# Patient Record
Sex: Male | Born: 1955 | Race: Black or African American | Hispanic: No | Marital: Single | Smoking: Never smoker
Health system: Southern US, Community
[De-identification: ages and names within clinical notes are randomized; demographics above are authoritative.]

## PROBLEM LIST (undated history)

## (undated) DIAGNOSIS — K648 Other hemorrhoids: Secondary | ICD-10-CM

## (undated) DIAGNOSIS — I10 Essential (primary) hypertension: Secondary | ICD-10-CM

## (undated) DIAGNOSIS — T464X5A Adverse effect of angiotensin-converting-enzyme inhibitors, initial encounter: Secondary | ICD-10-CM

## (undated) DIAGNOSIS — R058 Other specified cough: Secondary | ICD-10-CM

## (undated) HISTORY — DX: Adverse effect of angiotensin-converting-enzyme inhibitors, initial encounter: T46.4X5A

## (undated) HISTORY — DX: Other hemorrhoids: K64.8

## (undated) HISTORY — PX: BIOPSY PROSTATE: PRO28

## (undated) HISTORY — DX: Essential (primary) hypertension: I10

## (undated) HISTORY — DX: Other specified cough: R05.8

---

## 2007-03-04 ENCOUNTER — Encounter (INDEPENDENT_AMBULATORY_CARE_PROVIDER_SITE_OTHER): Payer: Self-pay | Admitting: *Deleted

## 2007-03-04 ENCOUNTER — Ambulatory Visit (HOSPITAL_COMMUNITY): Admission: RE | Admit: 2007-03-04 | Discharge: 2007-03-04 | Payer: Self-pay | Admitting: *Deleted

## 2011-02-14 NOTE — Op Note (Signed)
NAMERHODES, CALVERT NO.:  [id number]   MEDICAL RECORD NO.:  [id number]          PATIENT TYPE:  AMB   LOCATION:  ENDO                         FACILITY:  [facility]   PHYSICIAN:  Georgiana Spinner, M.D.    DATE OF BIRTH:  1956-09-27   DATE OF PROCEDURE:  03/04/2007  DATE OF DISCHARGE:                               OPERATIVE REPORT   PROCEDURE:  Colonoscopy with polypectomy.   INDICATIONS:  Colon cancer screening, colon polyp.   ANESTHESIA:  Demerol 80 mg, Versed 10 mg.   PROCEDURE:  With the patient mildly sedated in the left lateral  decubitus position, a rectal examination was performed which was  unremarkable to my examination.  Subsequently, the Pentax videoscopic  colonoscope was inserted into the rectum and passed under direct vision  to the cecum, identified by ileocecal valve and appendiceal orifice.  The prep was slightly suboptimal in that there was tenacious yellow-  green stool seen mostly in the right colon which we washed as best as  possible.  From this point, the colonoscope was slowly withdrawn, taking  circumferential views of colonic mucosa, stopping in the ascending colon  near the hepatic flexure where small polyp was seen, photographed and  removed using snare cautery technique setting of 20/200 blended current.  The polyp was suctioned into the endoscope and retrieved through a  tissue trap.  We then withdrew the colonoscope further, taking  circumferential views of the remaining colonic mucosa, stopping only in  the rectum which appeared normal on direct and showed hemorrhoids on  retroflexed view.  The endoscope was straightened and withdrawn.  The  patient's vital signs and pulse oximeter remained stable.  The patient  tolerated the procedure well without apparent complications.   FINDINGS:  Colon polyp of the ascending colon.  Internal hemorrhoids.  Otherwise an unremarkable exam.   PLAN:  Await biopsy report.  The patient will call me for  results and  follow-up with me as an outpatient.           ______________________________  Georgiana Spinner, M.D.     GMO/MEDQ  D:  03/04/2007  T:  03/04/2007  Job:  [id number]

## 2020-09-27 ENCOUNTER — Other Ambulatory Visit: Payer: Self-pay | Admitting: Urology

## 2020-09-27 DIAGNOSIS — R972 Elevated prostate specific antigen [PSA]: Secondary | ICD-10-CM

## 2020-10-27 ENCOUNTER — Ambulatory Visit
Admission: RE | Admit: 2020-10-27 | Discharge: 2020-10-27 | Disposition: A | Discharge status: Home / Self Care | Payer: Medicare Other | Source: Ambulatory Visit | Attending: Urology | Admitting: Urology

## 2020-10-27 ENCOUNTER — Other Ambulatory Visit: Payer: Self-pay

## 2020-10-27 DIAGNOSIS — I10 Essential (primary) hypertension: Secondary | ICD-10-CM | POA: Diagnosis not present

## 2020-10-27 DIAGNOSIS — Z0000 Encounter for general adult medical examination without abnormal findings: Secondary | ICD-10-CM | POA: Diagnosis not present

## 2020-10-27 DIAGNOSIS — E782 Mixed hyperlipidemia: Secondary | ICD-10-CM | POA: Diagnosis not present

## 2020-10-27 DIAGNOSIS — R590 Localized enlarged lymph nodes: Secondary | ICD-10-CM | POA: Diagnosis not present

## 2020-10-27 DIAGNOSIS — R972 Elevated prostate specific antigen [PSA]: Secondary | ICD-10-CM

## 2020-10-27 MED ORDER — GADOBENATE DIMEGLUMINE 529 MG/ML IV SOLN
18.0000 mL | Freq: Once | INTRAVENOUS | Status: AC | PRN
  Administered 2020-10-27: 18 mL via INTRAVENOUS

## 2020-11-01 DIAGNOSIS — Z0000 Encounter for general adult medical examination without abnormal findings: Secondary | ICD-10-CM | POA: Diagnosis not present

## 2020-11-01 DIAGNOSIS — I493 Ventricular premature depolarization: Secondary | ICD-10-CM | POA: Diagnosis not present

## 2020-11-01 DIAGNOSIS — Z86010 Personal history of colonic polyps: Secondary | ICD-10-CM | POA: Diagnosis not present

## 2020-11-01 DIAGNOSIS — R7303 Prediabetes: Secondary | ICD-10-CM | POA: Diagnosis not present

## 2020-11-01 DIAGNOSIS — E782 Mixed hyperlipidemia: Secondary | ICD-10-CM | POA: Diagnosis not present

## 2020-11-01 DIAGNOSIS — Z23 Encounter for immunization: Secondary | ICD-10-CM | POA: Diagnosis not present

## 2020-11-01 DIAGNOSIS — I10 Essential (primary) hypertension: Secondary | ICD-10-CM | POA: Diagnosis not present

## 2020-11-03 ENCOUNTER — Telehealth: Payer: Self-pay

## 2020-11-03 ENCOUNTER — Other Ambulatory Visit: Payer: Self-pay | Admitting: Internal Medicine

## 2020-11-03 DIAGNOSIS — E782 Mixed hyperlipidemia: Secondary | ICD-10-CM

## 2020-11-03 NOTE — Telephone Encounter (Signed)
NOTES ON FILE FROM [facility] [phone], SENT REFERRAL TO SCHEDULING 

## 2020-11-19 ENCOUNTER — Ambulatory Visit
Admission: RE | Admit: 2020-11-19 | Discharge: 2020-11-19 | Disposition: A | Discharge status: Home / Self Care | Payer: Medicare Other | Source: Ambulatory Visit | Attending: Internal Medicine | Admitting: Internal Medicine

## 2020-11-19 ENCOUNTER — Other Ambulatory Visit: Payer: Self-pay

## 2020-11-19 DIAGNOSIS — E782 Mixed hyperlipidemia: Secondary | ICD-10-CM

## 2020-11-19 DIAGNOSIS — R3911 Hesitancy of micturition: Secondary | ICD-10-CM | POA: Diagnosis not present

## 2020-11-28 NOTE — Progress Notes (Signed)
Cardiology Office Note:    Date:  12/01/2020   ID:  Jeffery [name], DOB [birth year], MRN [id number]  PCP:  Merri Brunette, MD   [facility]  Cardiologist:  No primary care provider on file.  Advanced Practice Provider:  No care team member to display Electrophysiologist:  None    Referring MD: Merri Brunette, MD    History of Present Illness:    Jeffery [name] is a 65 y.o. male with a hx of HTN and HLD who was referred by Dr. Renne Crigler for further evaluation of an abnormal ECG.  Patient states that he overall feels well. Has occasional chest achiness usually after he eats, which he attributes to reflux. Not exertional in nature. Patient is active and does yardwork without chest pain or SOB. No LE edema, orthopnea, or PND. No lightheadedness, dizziness or syncope. No palpitations. ECG shows LVH with PVCs. [name] pressures at home in 140s prior to taking his medication; runs in the 120-130s after taking his medication.   Coronary Calcium Score: Left Main: 0; LAD: 80; LCx: 24; RCA: 93; Total Agatston Score: 198 (82nd percentile for patient's age, gender and race/ethnicity)   Past Medical History:  Diagnosis Date  . Cough due to ACE inhibitor   . Hypertension   . Internal hemorrhoid     Past Surgical History:  Procedure Laterality Date  . BIOPSY PROSTATE      Current Medications: Current Meds  Medication Sig  . AMLODIPINE BESYLATE PO Take 5 mg by mouth.  Marland Kitchen aspirin EC 81 MG tablet Take 1 tablet (81 mg total) by mouth daily. Swallow whole.  . losartan-hydrochlorothiazide (HYZAAR) 100-25 MG tablet Take 1 tablet by mouth daily.  . meloxicam (MOBIC) 15 MG tablet Take 15 mg by mouth as needed.  . Multiple Vitamin (MULTIVITAMIN) tablet Take 1 tablet by mouth daily.  . rosuvastatin (CRESTOR) 20 MG tablet Take 20 mg by mouth at bedtime.     Allergies:   Patient has no known allergies.   Social History   Socioeconomic History  . Marital status: Single    Spouse  name: Not on file  . Number of children: Not on file  . Years of education: Not on file  . Highest education level: Not on file  Occupational History  . Not on file  Tobacco Use  . Smoking status: Never Smoker  . Smokeless tobacco: Never Used  Substance and Sexual Activity  . Alcohol use: Yes    Comment: 3-4 drinks a day  . Drug use: Never  . Sexual activity: Not on file  Other Topics Concern  . Not on file  Social History Narrative  . Not on file   Social Determinants of Health   Financial Resource Strain: Not on file  Food Insecurity: Not on file  Transportation Needs: Not on file  Physical Activity: Not on file  Stress: Not on file  Social Connections: Not on file     Family History: The patient's family history includes Aneurysm in his brother and father; Kidney disease in his father.  ROS:   Please see the history of present illness.    Review of Systems  Constitutional: Negative for chills and fever.  HENT: Negative for hearing loss.   Eyes: Negative for blurred vision and redness.  Respiratory: Negative for shortness of breath.   Cardiovascular: Negative for chest pain, palpitations, orthopnea, claudication, leg swelling and PND.  Gastrointestinal: Positive for heartburn. Negative for melena, nausea and vomiting.  Genitourinary: Negative for  flank pain.  Musculoskeletal: Negative for myalgias.  Neurological: Negative for dizziness and loss of consciousness.  Endo/Heme/Allergies: Negative for polydipsia.  Psychiatric/Behavioral: Negative for substance abuse.    EKGs/Labs/Other Studies Reviewed:    The following studies were reviewed today: CT Calcium 11/19/20 FINDINGS: CORONARY CALCIUM SCORES:  Left Main: 0  LAD: 80  LCx: 24  RCA: 93  Total Agatston Score: 198  MESA database percentile: 82  AORTA MEASUREMENTS:  Ascending Aorta: 37 mm  Descending Aorta: 30 mm  OTHER FINDINGS:  Cardiovascular: Coronary artery calcium as  described. Heart size is top normal without pericardial effusion  Mediastinum/Nodes: No adenopathy in the mediastinum. Limited visualization of the mediastinum. Mildly patulous esophagus.  Lungs/Pleura: Airways are patent, visualized airways. Lungs with respect to visualized portions of the chest are clear.  Upper Abdomen: Incidental imaging of upper abdominal contents is unremarkable.  Musculoskeletal: No acute bone finding or destructive bone process. Spinal degenerative changes.  IMPRESSION: Coronary artery calcium score of 198. This places the patient in the MESA database 82nd percentile for patient's age, gender and race/ethnicity.  3.7 cm ascending thoracic aortic dilation. Recommend annual imaging followup by CTA or MRA. This recommendation follows 2010 ACCF/AHA/AATS/ACR/ASA/SCA/SCAI/SIR/STS/SVM Guidelines for the Diagnosis and Management of Patients with Thoracic Aortic Disease. Circulation.2010; 121: B341-P379. Aortic aneurysm NOS (ICD10-I71.9)  EKG:  EKG is  ordered today.  The ekg ordered today demonstrates NSR with LVH with occasional PVC, HR 71bpm  Recent Labs: No results found for requested labs within last 8760 hours.  Recent Lipid Panel No results found for: CHOL, TRIG, HDL, CHOLHDL, VLDL, LDLCALC, LDLDIRECT   Physical Exam:    VS:  BP 136/90   Pulse 71   Ht 5\' 8"  (1.727 m)   Wt 200 lb 6.4 oz (90.9 kg)   SpO2 98%   BMI 30.47 kg/m     Wt Readings from Last 3 Encounters:  12/01/20 200 lb 6.4 oz (90.9 kg)     GEN:  Well nourished, well developed in no acute distress HEENT: Normal NECK: No JVD; No carotid bruits CARDIAC: RRR, no murmurs, rubs, gallops RESPIRATORY:  Clear to auscultation without rales, wheezing or rhonchi  ABDOMEN: Soft, non-tender, non-distended MUSCULOSKELETAL:  No edema; No deformity  SKIN: Warm and dry NEUROLOGIC:  Alert and oriented x 3 PSYCHIATRIC:  Normal affect   ASSESSMENT:    1. Nonspecific abnormal  electrocardiogram (ECG) (EKG)   2. PVC's (premature ventricular contractions)   3. Coronary artery calcification   4. Primary hypertension   5. Mixed hyperlipidemia    PLAN:    In order of problems listed above:  #Abnormal ECG: #PVCs: #LVH: Patient noted to have frequent PVCs and LVH on routine exam at PCPs office. Patient is reassuringly asymptomatic with no chest pain, SOB, palpitations, lightheadedness, or syncope. Will check TTE and cardiac monitor. -Check 7 day zio to assess PVC burden -Check TTE  #Coronary calcification: Coronary Calcium Score: 198 (82nd percentile for patient's age, gender and race/ethnicity) Left Main: 0; LAD: 70; LCx: 24; RCA: 50. Patient asymptomatic. -Work-up PVCs as above -Start ASA 81mg  daily -Continue crestor 20mg  daily -Diet and lifestyle modifications discussed at length as detailed below  #HTN: -Continue Losartan-HCTZ and amlodipine -Will keep log to ensure [name] pressure and call if BP>120/80s  #HLD: -Continue crestor as above  Exercise recommendations: Goal of exercising for at least 30 minutes a day, at least 5 times per week.  Please exercise to a moderate exertion.  This means that while exercising it is difficult  to speak in full sentences, however you are not so short of breath that you feel you must stop, and not so comfortable that you can carry on a full conversation.  Exertion level should be approximately a 5/10, if 10 is the most exertion you can perform.  Diet recommendations: Recommend a heart healthy diet such as the Mediterranean diet.  This diet consists of plant based foods, healthy fats, lean meats, olive oil.  It suggests limiting the intake of simple carbohydrates such as white breads, pastries, and pastas.  It also limits the amount of red meat, wine, and dairy products such as cheese that one should consume on a daily basis.     Medication Adjustments/Labs and Tests Ordered: Current medicines are reviewed at length with  the patient today.  Concerns regarding medicines are outlined above.  Orders Placed This Encounter  Procedures  . LONG TERM MONITOR (3-14 DAYS)  . EKG 12-Lead  . ECHOCARDIOGRAM COMPLETE   Meds ordered this encounter  Medications  . aspirin EC 81 MG tablet    Sig: Take 1 tablet (81 mg total) by mouth daily. Swallow whole.    Dispense:  90 tablet    Refill:  3    Patient Instructions   Medication Instructions:  1) START ASPIRIN 81 mg daily *If you need a refill on your cardiac medications before your next appointment, please call your pharmacy*  Testing/Procedures: Your provider has requested that you have an echocardiogram. Echocardiography is a painless test that uses sound waves to create images of your heart. It provides your doctor with information about the size and shape of your heart and how well your heart's chambers and valves are working. This procedure takes approximately one hour. There are no restrictions for this procedure.   Dr. Shari ProwsPemberton recommends you wear a 7 day heart monitor.  Follow-Up: At [facility], you and your health needs are our priority.  As part of our continuing mission to provide you with exceptional heart care, we have created designated Provider Care Teams.  These Care Teams include your primary Cardiologist (physician) and Advanced Practice Providers (APPs -  Physician Assistants and Nurse Practitioners) who all work together to provide you with the care you need, when you need it. Your next appointment:   6 month(s) The format for your next appointment:   In Person Provider:   You may see Dr. Shari ProwsPemberton or one of the following Advanced Practice Providers on your designated Care Team:    Tereso NewcomerScott Weaver, PA-C  Vin Bhagat, PA-C   ZIO XT- Long Term Monitor Instructions   Your physician has requested you wear your ZIO patch monitor 7 days.   This is a single patch monitor.  [employer] supplies one patch monitor per enrollment.  Additional stickers  are not available.   Please do not apply patch if you will be having a Nuclear Stress Test, Echocardiogram, Cardiac CT, MRI, or Chest Xray during the time frame you would be wearing the monitor. The patch cannot be worn during these tests.  You cannot remove and re-apply the ZIO XT patch monitor.   Your ZIO patch monitor will be sent USPS Priority mail from [facility] directly to your home address. The monitor may also be mailed to a PO BOX if home delivery is not available.   It may take 3-5 days to receive your monitor after you have been enrolled.   Once you have received you monitor, please review enclosed instructions.  Your monitor has already  been registered assigning a specific monitor serial # to you.   Applying the monitor   Shave hair from upper left chest.   Hold abrader disc by orange tab.  Rub abrader in 40 strokes over left upper chest as indicated in your monitor instructions.   Clean area with 4 enclosed alcohol pads .  Use all pads to assure are is cleaned thoroughly.  Let dry.   Apply patch as indicated in monitor instructions.  Patch will be place under collarbone on left side of chest with arrow pointing upward.   Rub patch adhesive wings for 2 minutes.Remove white label marked "1".  Remove white label marked "2".  Rub patch adhesive wings for 2 additional minutes.   While looking in a mirror, press and release button in center of patch.  A small green light will flash 3-4 times .  This will be your only indicator the monitor has been turned on.     Do not shower for the first 24 hours.  You may shower after the first 24 hours.   Press button if you feel a symptom. You will hear a small click.  Record Date, Time and Symptom in the Patient Log Book.   When you are ready to remove patch, follow instructions on last 2 pages of Patient Log Book.  Stick patch monitor onto last page of Patient Log Book.   Place Patient Log Book in [place] box.  Use locking tab on box  and tape box closed securely.  The [employer] has [employer] on it.  Please place in mailbox as soon as possible.  Your physician should have your test results approximately 7 days after the monitor has been mailed back to [facility].   Call [facility] Customer Care at [phone] if you have questions regarding your ZIO XT patch monitor.  Call them immediately if you see an orange light blinking on your monitor.   If your monitor falls off in less than 4 days contact our Monitor department at [phone].  If your monitor becomes loose or falls off after 4 days call [employer] at [phone] for suggestions on securing your monitor.    Mediterranean Diet A Mediterranean diet refers to food and lifestyle choices that are based on the traditions of countries located on the [place]. This way of eating has been shown to help prevent certain conditions and improve outcomes for people who have chronic diseases, like kidney disease and heart disease. What are tips for following this plan? Lifestyle  Cook and eat meals together with your family, when possible.  Drink enough fluid to keep your urine clear or pale yellow.  Be physically active every day. This includes: ? Aerobic exercise like running or swimming. ? Leisure activities like gardening, walking, or housework.  Get 7-8 hours of sleep each night.  If recommended by your health care provider, drink red wine in moderation. This means 1 glass a day for nonpregnant women and 2 glasses a day for men. A glass of wine equals 5 oz (150 mL). Reading food labels  Check the serving size of packaged foods. For foods such as rice and pasta, the serving size refers to the amount of cooked product, not dry.  Check the total fat in packaged foods. Avoid foods that have saturated fat or trans fats.  Check the ingredients list for added sugars, such as corn syrup.   Shopping  At the grocery store, buy most of your  food from  the areas near the walls of the store. This includes: ? Fresh fruits and vegetables (produce). ? Grains, beans, nuts, and seeds. Some of these may be available in unpackaged forms or large amounts (in bulk). ? Fresh seafood. ? Poultry and eggs. ? Low-fat dairy products.  Buy whole ingredients instead of prepackaged foods.  Buy fresh fruits and vegetables in-season from local farmers markets.  Buy frozen fruits and vegetables in resealable bags.  If you do not have access to quality fresh seafood, buy precooked frozen shrimp or canned fish, such as tuna, salmon, or sardines.  Buy small amounts of raw or cooked vegetables, salads, or olives from the deli or salad bar at your store.  Stock your pantry so you always have certain foods on hand, such as olive oil, canned tuna, canned tomatoes, rice, pasta, and beans. Cooking  Cook foods with extra-virgin olive oil instead of using butter or other vegetable oils.  Have meat as a side dish, and have vegetables or grains as your main dish. This means having meat in small portions or adding small amounts of meat to foods like pasta or stew.  Use beans or vegetables instead of meat in common dishes like chili or lasagna.  Experiment with different cooking methods. Try roasting or broiling vegetables instead of steaming or sauteing them.  Add frozen vegetables to soups, stews, pasta, or rice.  Add nuts or seeds for added healthy fat at each meal. You can add these to yogurt, salads, or vegetable dishes.  Marinate fish or vegetables using olive oil, lemon juice, garlic, and fresh herbs. Meal planning  Plan to eat 1 vegetarian meal one day each week. Try to work up to 2 vegetarian meals, if possible.  Eat seafood 2 or more times a week.  Have healthy snacks readily available, such as: ? Vegetable sticks with hummus. ? [place] yogurt. ? Fruit and nut trail mix.  Eat balanced meals throughout the week. This includes: ? Fruit:  2-3 servings a day ? Vegetables: 4-5 servings a day ? Low-fat dairy: 2 servings a day ? Fish, poultry, or lean meat: 1 serving a day ? Beans and legumes: 2 or more servings a week ? Nuts and seeds: 1-2 servings a day ? Whole grains: 6-8 servings a day ? Extra-virgin olive oil: 3-4 servings a day  Limit red meat and sweets to only a few servings a month   What are my food choices?  Mediterranean diet ? Recommended  Grains: Whole-grain pasta. Brown rice. Bulgar wheat. Polenta. Couscous. Whole-wheat bread. Orpah Cobb.  Vegetables: Artichokes. Beets. Broccoli. Cabbage. Carrots. Eggplant. Green beans. Chard. Kale. Spinach. Onions. Leeks. Peas. Squash. Tomatoes. Peppers. Radishes.  Fruits: Apples. Apricots. Avocado. Berries. Bananas. Cherries. Dates. Figs. Grapes. Lemons. Melon. Oranges. Peaches. Plums. Pomegranate.  Meats and other protein foods: Beans. Almonds. Sunflower seeds. Pine nuts. Peanuts. Cod. Salmon. Scallops. Shrimp. Tuna. Tilapia. Clams. Oysters. Eggs.  Dairy: Low-fat milk. Cheese. Greek yogurt.  Beverages: Water. Red wine. Herbal tea.  Fats and oils: Extra virgin olive oil. Avocado oil. Grape seed oil.  Sweets and desserts: [place] yogurt with honey. Baked apples. Poached pears. Trail mix.  Seasoning and other foods: Basil. Cilantro. Coriander. Cumin. Mint. Parsley. Sage. Rosemary. Tarragon. Garlic. Oregano. Thyme. Pepper. Balsalmic vinegar. Tahini. Hummus. Tomato sauce. Olives. Mushrooms. ? Limit these  Grains: Prepackaged pasta or rice dishes. Prepackaged cereal with added sugar.  Vegetables: Deep fried potatoes (french fries).  Fruits: Fruit canned in syrup.  Meats and other protein foods: Beef. Pork.  Leslie Dales with skin. Hot dogs. Tomasa Blase.  Dairy: Ice cream. Sour cream. Whole milk.  Beverages: Juice. Sugar-sweetened soft drinks. Beer. Liquor and spirits.  Fats and oils: Butter. Canola oil. Vegetable oil. Beef fat (tallow). Lard.  Sweets and desserts:  Cookies. Cakes. Pies. Candy.  Seasoning and other foods: Mayonnaise. Premade sauces and marinades. The items listed may not be a complete list. Talk with your dietitian about what dietary choices are right for you. Summary  The Mediterranean diet includes both food and lifestyle choices.  Eat a variety of fresh fruits and vegetables, beans, nuts, seeds, and whole grains.  Limit the amount of red meat and sweets that you eat.  Talk with your health care provider about whether it is safe for you to drink red wine in moderation. This means 1 glass a day for nonpregnant women and 2 glasses a day for men. A glass of wine equals 5 oz (150 mL). This information is not intended to replace advice given to you by your health care provider. Make sure you discuss any questions you have with your health care provider. Document Revised: 05/18/2016 Document Reviewed: 05/11/2016 Elsevier Patient Education  2020 [employer].     Signed, Meriam Sprague, MD  12/01/2020 10:47 AM    [facility]

## 2020-11-30 DIAGNOSIS — I2510 Atherosclerotic heart disease of native coronary artery without angina pectoris: Secondary | ICD-10-CM | POA: Diagnosis not present

## 2020-11-30 DIAGNOSIS — I77810 Thoracic aortic ectasia: Secondary | ICD-10-CM | POA: Diagnosis not present

## 2020-11-30 DIAGNOSIS — R7309 Other abnormal glucose: Secondary | ICD-10-CM | POA: Diagnosis not present

## 2020-11-30 DIAGNOSIS — I10 Essential (primary) hypertension: Secondary | ICD-10-CM | POA: Diagnosis not present

## 2020-12-01 ENCOUNTER — Ambulatory Visit: Payer: Medicare Other | Admitting: Cardiology

## 2020-12-01 ENCOUNTER — Ambulatory Visit (INDEPENDENT_AMBULATORY_CARE_PROVIDER_SITE_OTHER): Payer: Medicare Other

## 2020-12-01 ENCOUNTER — Encounter: Payer: Self-pay | Admitting: *Deleted

## 2020-12-01 ENCOUNTER — Other Ambulatory Visit: Payer: Self-pay

## 2020-12-01 ENCOUNTER — Encounter: Payer: Self-pay | Admitting: Cardiology

## 2020-12-01 VITALS — BP 136/90 | HR 71 | Ht 68.0 in | Wt 200.4 lb

## 2020-12-01 DIAGNOSIS — I2510 Atherosclerotic heart disease of native coronary artery without angina pectoris: Secondary | ICD-10-CM | POA: Diagnosis not present

## 2020-12-01 DIAGNOSIS — I10 Essential (primary) hypertension: Secondary | ICD-10-CM

## 2020-12-01 DIAGNOSIS — I493 Ventricular premature depolarization: Secondary | ICD-10-CM

## 2020-12-01 DIAGNOSIS — R9431 Abnormal electrocardiogram [ECG] [EKG]: Secondary | ICD-10-CM

## 2020-12-01 DIAGNOSIS — I2584 Coronary atherosclerosis due to calcified coronary lesion: Secondary | ICD-10-CM

## 2020-12-01 DIAGNOSIS — E782 Mixed hyperlipidemia: Secondary | ICD-10-CM

## 2020-12-01 MED ORDER — ASPIRIN EC 81 MG PO TBEC: 81.0000 mg | DELAYED_RELEASE_TABLET | Freq: Every day | ORAL | 3 refills | Status: AC

## 2020-12-01 NOTE — Patient Instructions (Signed)
Medication Instructions:  1) START ASPIRIN 81 mg daily *If you need a refill on your cardiac medications before your next appointment, please call your pharmacy*  Testing/Procedures: Your provider has requested that you have an echocardiogram. Echocardiography is a painless test that uses sound waves to create images of your heart. It provides your doctor with information about the size and shape of your heart and how well your heart's chambers and valves are working. This procedure takes approximately one hour. There are no restrictions for this procedure.   Dr. Shari Prows recommends you wear a 7 day heart monitor.  Follow-Up: At [facility], you and your health needs are our priority.  As part of our continuing mission to provide you with exceptional heart care, we have created designated Provider Care Teams.  These Care Teams include your primary Cardiologist (physician) and Advanced Practice Providers (APPs -  Physician Assistants and Nurse Practitioners) who all work together to provide you with the care you need, when you need it. Your next appointment:   6 month(s) The format for your next appointment:   In Person Provider:   You may see Dr. Shari Prows or one of the following Advanced Practice Providers on your designated Care Team:    Tereso Newcomer, PA-C  Vin Bhagat, PA-C   ZIO XT- Long Term Monitor Instructions   Your physician has requested you wear your ZIO patch monitor 7 days.   This is a single patch monitor.  [employer] supplies one patch monitor per enrollment.  Additional stickers are not available.   Please do not apply patch if you will be having a Nuclear Stress Test, Echocardiogram, Cardiac CT, MRI, or Chest Xray during the time frame you would be wearing the monitor. The patch cannot be worn during these tests.  You cannot remove and re-apply the ZIO XT patch monitor.   Your ZIO patch monitor will be sent USPS Priority mail from [facility] directly to your  home address. The monitor may also be mailed to a PO BOX if home delivery is not available.   It may take 3-5 days to receive your monitor after you have been enrolled.   Once you have received you monitor, please review enclosed instructions.  Your monitor has already been registered assigning a specific monitor serial # to you.   Applying the monitor   Shave hair from upper left chest.   Hold abrader disc by orange tab.  Rub abrader in 40 strokes over left upper chest as indicated in your monitor instructions.   Clean area with 4 enclosed alcohol pads .  Use all pads to assure are is cleaned thoroughly.  Let dry.   Apply patch as indicated in monitor instructions.  Patch will be place under collarbone on left side of chest with arrow pointing upward.   Rub patch adhesive wings for 2 minutes.Remove white label marked "1".  Remove white label marked "2".  Rub patch adhesive wings for 2 additional minutes.   While looking in a mirror, press and release button in center of patch.  A small green light will flash 3-4 times .  This will be your only indicator the monitor has been turned on.     Do not shower for the first 24 hours.  You may shower after the first 24 hours.   Press button if you feel a symptom. You will hear a small click.  Record Date, Time and Symptom in the Patient Log Book.   When you are ready  to remove patch, follow instructions on last 2 pages of Patient Log Book.  Stick patch monitor onto last page of Patient Log Book.   Place Patient Log Book in Rose box.  Use locking tab on box and tape box closed securely.  The [employer] and [employer] has [employer] on it.  Please place in mailbox as soon as possible.  Your physician should have your test results approximately 7 days after the monitor has been mailed back to [facility].   Call [facility] Customer Care at [phone] if you have questions regarding your ZIO XT patch monitor.  Call them immediately if you see  an orange light blinking on your monitor.   If your monitor falls off in less than 4 days contact our Monitor department at [phone].  If your monitor becomes loose or falls off after 4 days call [employer] at [phone] for suggestions on securing your monitor.    Mediterranean Diet A Mediterranean diet refers to food and lifestyle choices that are based on the traditions of countries located on the [place]. This way of eating has been shown to help prevent certain conditions and improve outcomes for people who have chronic diseases, like kidney disease and heart disease. What are tips for following this plan? Lifestyle  Cook and eat meals together with your family, when possible.  Drink enough fluid to keep your urine clear or pale yellow.  Be physically active every day. This includes: ? Aerobic exercise like running or swimming. ? Leisure activities like gardening, walking, or housework.  Get 7-8 hours of sleep each night.  If recommended by your health care provider, drink red wine in moderation. This means 1 glass a day for nonpregnant women and 2 glasses a day for men. A glass of wine equals 5 oz (150 mL). Reading food labels  Check the serving size of packaged foods. For foods such as rice and pasta, the serving size refers to the amount of cooked product, not dry.  Check the total fat in packaged foods. Avoid foods that have saturated fat or trans fats.  Check the ingredients list for added sugars, such as corn syrup.   Shopping  At the grocery store, buy most of your food from the areas near the walls of the store. This includes: ? Fresh fruits and vegetables (produce). ? Grains, beans, nuts, and seeds. Some of these may be available in unpackaged forms or large amounts (in bulk). ? Fresh seafood. ? Poultry and eggs. ? Low-fat dairy products.  Buy whole ingredients instead of prepackaged foods.  Buy fresh fruits and vegetables in-season from local  farmers markets.  Buy frozen fruits and vegetables in resealable bags.  If you do not have access to quality fresh seafood, buy precooked frozen shrimp or canned fish, such as tuna, salmon, or sardines.  Buy small amounts of raw or cooked vegetables, salads, or olives from the deli or salad bar at your store.  Stock your pantry so you always have certain foods on hand, such as olive oil, canned tuna, canned tomatoes, rice, pasta, and beans. Cooking  Cook foods with extra-virgin olive oil instead of using butter or other vegetable oils.  Have meat as a side dish, and have vegetables or grains as your main dish. This means having meat in small portions or adding small amounts of meat to foods like pasta or stew.  Use beans or vegetables instead of meat in common dishes like chili or lasagna.  Experiment with different  cooking methods. Try roasting or broiling vegetables instead of steaming or sauteing them.  Add frozen vegetables to soups, stews, pasta, or rice.  Add nuts or seeds for added healthy fat at each meal. You can add these to yogurt, salads, or vegetable dishes.  Marinate fish or vegetables using olive oil, lemon juice, garlic, and fresh herbs. Meal planning  Plan to eat 1 vegetarian meal one day each week. Try to work up to 2 vegetarian meals, if possible.  Eat seafood 2 or more times a week.  Have healthy snacks readily available, such as: ? Vegetable sticks with hummus. ? [place] yogurt. ? Fruit and nut trail mix.  Eat balanced meals throughout the week. This includes: ? Fruit: 2-3 servings a day ? Vegetables: 4-5 servings a day ? Low-fat dairy: 2 servings a day ? Fish, poultry, or lean meat: 1 serving a day ? Beans and legumes: 2 or more servings a week ? Nuts and seeds: 1-2 servings a day ? Whole grains: 6-8 servings a day ? Extra-virgin olive oil: 3-4 servings a day  Limit red meat and sweets to only a few servings a month   What are my food  choices?  Mediterranean diet ? Recommended  Grains: Whole-grain pasta. Brown rice. Bulgar wheat. Polenta. Couscous. Whole-wheat bread. Orpah Cobb.  Vegetables: Artichokes. Beets. Broccoli. Cabbage. Carrots. Eggplant. Green beans. Chard. Kale. Spinach. Onions. Leeks. Peas. Squash. Tomatoes. Peppers. Radishes.  Fruits: Apples. Apricots. Avocado. Berries. Bananas. Cherries. Dates. Figs. Grapes. Lemons. Melon. Oranges. Peaches. Plums. Pomegranate.  Meats and other protein foods: Beans. Almonds. Sunflower seeds. Pine nuts. Peanuts. Cod. Salmon. Scallops. Shrimp. Tuna. Tilapia. Clams. Oysters. Eggs.  Dairy: Low-fat milk. Cheese. Greek yogurt.  Beverages: Water. Red wine. Herbal tea.  Fats and oils: Extra virgin olive oil. Avocado oil. Grape seed oil.  Sweets and desserts: [place] yogurt with honey. Baked apples. Poached pears. Trail mix.  Seasoning and other foods: Basil. Cilantro. Coriander. Cumin. Mint. Parsley. Sage. Rosemary. Tarragon. Garlic. Oregano. Thyme. Pepper. Balsalmic vinegar. Tahini. Hummus. Tomato sauce. Olives. Mushrooms. ? Limit these  Grains: Prepackaged pasta or rice dishes. Prepackaged cereal with added sugar.  Vegetables: Deep fried potatoes (french fries).  Fruits: Fruit canned in syrup.  Meats and other protein foods: Beef. Pork. Lamb. Poultry with skin. Hot dogs. Tomasa Blase.  Dairy: Ice cream. Sour cream. Whole milk.  Beverages: Juice. Sugar-sweetened soft drinks. Beer. Liquor and spirits.  Fats and oils: Butter. Canola oil. Vegetable oil. Beef fat (tallow). Lard.  Sweets and desserts: Cookies. Cakes. Pies. Candy.  Seasoning and other foods: Mayonnaise. Premade sauces and marinades. The items listed may not be a complete list. Talk with your dietitian about what dietary choices are right for you. Summary  The Mediterranean diet includes both food and lifestyle choices.  Eat a variety of fresh fruits and vegetables, beans, nuts, seeds, and whole  grains.  Limit the amount of red meat and sweets that you eat.  Talk with your health care provider about whether it is safe for you to drink red wine in moderation. This means 1 glass a day for nonpregnant women and 2 glasses a day for men. A glass of wine equals 5 oz (150 mL). This information is not intended to replace advice given to you by your health care provider. Make sure you discuss any questions you have with your health care provider. Document Revised: 05/18/2016 Document Reviewed: 05/11/2016 Elsevier Patient Education  2020 [employer].

## 2020-12-01 NOTE — Progress Notes (Signed)
Patient ID: Jeffery [name], male   DOB: 1956/09/16, 65 y.o.   MRN: [id number] Patient enrolled for [employer] to ship a 7 day ZIO XT long term holter monitor to his home.

## 2020-12-03 DIAGNOSIS — I493 Ventricular premature depolarization: Secondary | ICD-10-CM

## 2020-12-16 DIAGNOSIS — I493 Ventricular premature depolarization: Secondary | ICD-10-CM | POA: Diagnosis not present

## 2020-12-20 ENCOUNTER — Telehealth: Payer: Self-pay | Admitting: Cardiology

## 2020-12-20 ENCOUNTER — Other Ambulatory Visit: Payer: Self-pay | Admitting: Cardiology

## 2020-12-20 ENCOUNTER — Telehealth: Payer: Self-pay | Admitting: *Deleted

## 2020-12-20 DIAGNOSIS — I472 Ventricular tachycardia, unspecified: Secondary | ICD-10-CM

## 2020-12-20 DIAGNOSIS — R0602 Shortness of breath: Secondary | ICD-10-CM

## 2020-12-20 DIAGNOSIS — I493 Ventricular premature depolarization: Secondary | ICD-10-CM

## 2020-12-20 NOTE — Telephone Encounter (Signed)
-----   Message from Meriam Sprague, MD sent at 12/20/2020  3:27 PM EDT ----- The patient is having a lot of runs of non-sustained VT and frequent PVCs on his heart monitor. Given the amount of abnormal beats from the bottom chamber of the heart, we should make sure there is no blockages in his heart arteries and that his heart structure is normal. Can we get a lexiscan and a cardiac MRI? These will help compliment the echo in terms of work-up of these extra beats.

## 2020-12-20 NOTE — Telephone Encounter (Signed)
Will route to Dr. Shari Prows to sign attestation that is pended in phone note.

## 2020-12-20 NOTE — Telephone Encounter (Signed)
Spoke with patient regarding preferred weekdays and times for scheduling the Cardiac MRI ordered by Dr. Shari Prows.  Informed patient as soon as we hear regarding the insurance prior authorization--I will be in touch with the appointment information----he voiced his understanding.

## 2020-12-20 NOTE — Telephone Encounter (Signed)
Spoke with pt and made him aware of results and recommendations per Dr. Shari Prows.  Went over stress test instructions.  Advised someone from both depts would be in contact to get him scheduled.  Pt verbalized understanding and was appreciative for call.

## 2020-12-22 ENCOUNTER — Encounter: Payer: Self-pay | Admitting: Cardiology

## 2020-12-22 NOTE — Telephone Encounter (Signed)
Patient aware of Friday 02/11/21 9:00 am Cardiac MRI appointment at [facility]---arrival time is 8:30 am--1st floor admissions office for check in----will mail information to patient and it is available in My Chart.

## 2020-12-24 ENCOUNTER — Ambulatory Visit (HOSPITAL_COMMUNITY): Payer: Medicare Other | Attending: Cardiology

## 2020-12-24 ENCOUNTER — Other Ambulatory Visit: Payer: Self-pay

## 2020-12-24 DIAGNOSIS — E785 Hyperlipidemia, unspecified: Secondary | ICD-10-CM | POA: Insufficient documentation

## 2020-12-24 DIAGNOSIS — R9431 Abnormal electrocardiogram [ECG] [EKG]: Secondary | ICD-10-CM | POA: Diagnosis not present

## 2020-12-24 DIAGNOSIS — I493 Ventricular premature depolarization: Secondary | ICD-10-CM | POA: Insufficient documentation

## 2020-12-24 DIAGNOSIS — I10 Essential (primary) hypertension: Secondary | ICD-10-CM | POA: Insufficient documentation

## 2020-12-24 LAB — ECHOCARDIOGRAM COMPLETE
Area-P 1/2: 2.82 cm2
S' Lateral: 3.9 cm

## 2020-12-28 NOTE — Addendum Note (Signed)
Addended by: Laurance Flatten on: 12/28/2020 01:11 PM   Modules accepted: Orders

## 2021-01-10 ENCOUNTER — Telehealth (HOSPITAL_COMMUNITY): Payer: Self-pay

## 2021-01-10 DIAGNOSIS — I2510 Atherosclerotic heart disease of native coronary artery without angina pectoris: Secondary | ICD-10-CM | POA: Diagnosis not present

## 2021-01-10 DIAGNOSIS — R7309 Other abnormal glucose: Secondary | ICD-10-CM | POA: Diagnosis not present

## 2021-01-10 DIAGNOSIS — I10 Essential (primary) hypertension: Secondary | ICD-10-CM | POA: Diagnosis not present

## 2021-01-10 NOTE — Telephone Encounter (Signed)
Spoke with the patient, detailed instructions given. He stated that understood and would be here for his test. Asked to call back with any questions. S.Marilyne Haseley EMTP

## 2021-01-12 DIAGNOSIS — I10 Essential (primary) hypertension: Secondary | ICD-10-CM | POA: Diagnosis not present

## 2021-01-12 DIAGNOSIS — I2510 Atherosclerotic heart disease of native coronary artery without angina pectoris: Secondary | ICD-10-CM | POA: Diagnosis not present

## 2021-01-12 DIAGNOSIS — R7309 Other abnormal glucose: Secondary | ICD-10-CM | POA: Diagnosis not present

## 2021-01-12 DIAGNOSIS — I77810 Thoracic aortic ectasia: Secondary | ICD-10-CM | POA: Diagnosis not present

## 2021-01-13 ENCOUNTER — Other Ambulatory Visit: Payer: Self-pay

## 2021-01-13 ENCOUNTER — Ambulatory Visit (HOSPITAL_COMMUNITY): Payer: Medicare Other | Attending: Internal Medicine

## 2021-01-13 DIAGNOSIS — I493 Ventricular premature depolarization: Secondary | ICD-10-CM | POA: Diagnosis not present

## 2021-01-13 LAB — MYOCARDIAL PERFUSION IMAGING
LV dias vol: 206 mL (ref 62–150)
LV sys vol: 122 mL
Peak HR: 102 {beats}/min
Rest HR: 73 {beats}/min
SDS: 1
SRS: 0
SSS: 1
TID: 1.07

## 2021-01-13 MED ORDER — TECHNETIUM TC 99M TETROFOSMIN IV KIT
10.4000 | PACK | Freq: Once | INTRAVENOUS | Status: AC | PRN
  Administered 2021-01-13: 10.4 via INTRAVENOUS
  Filled 2021-01-13: qty 11

## 2021-01-13 MED ORDER — REGADENOSON 0.4 MG/5ML IV SOLN
0.4000 mg | Freq: Once | INTRAVENOUS | Status: AC
Start: 2021-01-13 — End: 2021-01-13
  Administered 2021-01-13: 0.4 mg via INTRAVENOUS

## 2021-01-13 MED ORDER — TECHNETIUM TC 99M TETROFOSMIN IV KIT
31.2000 | PACK | Freq: Once | INTRAVENOUS | Status: AC | PRN
  Administered 2021-01-13: 31.2 via INTRAVENOUS
  Filled 2021-01-13: qty 32

## 2021-01-19 ENCOUNTER — Telehealth: Payer: Self-pay | Admitting: Cardiology

## 2021-01-19 NOTE — Telephone Encounter (Signed)
-----   Message from Meriam Sprague, MD sent at 01/18/2021  1:31 PM EDT ----- Stress test is mild decreased blood flow in a small segment of the heart. The cardiac MRI will help work this up further and will better determine if this is truly decreased blood flow or artifact related to the test and not a true defect.

## 2021-01-19 NOTE — Telephone Encounter (Signed)
The patient has been notified of the result and verbalized understanding.  All questions (if any) were answered. Pt aware to follow-up as planned for Cardiac MRI on 02/11/21 at 0900 and [facility].  Pt verbalized understanding and agrees with this plan.

## 2021-01-19 NOTE — Telephone Encounter (Signed)
Patient is calling back for results.

## 2021-01-29 DIAGNOSIS — I2510 Atherosclerotic heart disease of native coronary artery without angina pectoris: Secondary | ICD-10-CM | POA: Diagnosis not present

## 2021-01-29 DIAGNOSIS — M170 Bilateral primary osteoarthritis of knee: Secondary | ICD-10-CM | POA: Diagnosis not present

## 2021-01-29 DIAGNOSIS — E782 Mixed hyperlipidemia: Secondary | ICD-10-CM | POA: Diagnosis not present

## 2021-01-29 DIAGNOSIS — I10 Essential (primary) hypertension: Secondary | ICD-10-CM | POA: Diagnosis not present

## 2021-02-04 ENCOUNTER — Other Ambulatory Visit (HOSPITAL_COMMUNITY): Payer: Medicare Other

## 2021-02-09 ENCOUNTER — Telehealth (HOSPITAL_COMMUNITY): Payer: Self-pay | Admitting: Emergency Medicine

## 2021-02-09 NOTE — Telephone Encounter (Signed)
Reaching out to patient to offer assistance regarding upcoming cardiac imaging study; pt verbalizes understanding of appt date/time, parking situation and where to check in,and verified current allergies; name and call back number provided for further questions should they arise Jeffery Alexandria RN Navigator Cardiac Imaging [name] [phone] office [phone] cell  Spoke with wife who confirmed patient does not have metal implants or claustro Jeffery [name]

## 2021-02-11 ENCOUNTER — Ambulatory Visit (HOSPITAL_COMMUNITY)
Admission: RE | Admit: 2021-02-11 | Discharge: 2021-02-11 | Disposition: A | Discharge status: Home / Self Care | Payer: Medicare Other | Source: Ambulatory Visit | Attending: Cardiology | Admitting: Cardiology

## 2021-02-11 ENCOUNTER — Other Ambulatory Visit: Payer: Self-pay

## 2021-02-11 DIAGNOSIS — I493 Ventricular premature depolarization: Secondary | ICD-10-CM | POA: Diagnosis not present

## 2021-02-11 MED ORDER — GADOBUTROL 1 MMOL/ML IV SOLN
7.0000 mL | Freq: Once | INTRAVENOUS | Status: AC | PRN
  Administered 2021-02-11: 7 mL via INTRAVENOUS

## 2021-02-15 NOTE — Progress Notes (Signed)
Cardiology Office Note:    Date:  02/17/2021   ID:  [name], DOB [birth year], MRN [id number]  PCP:  Merri Brunette, MD   [facility]  Cardiologist:  None  Advanced Practice Provider:  No care team member to display Electrophysiologist:  None    Referring MD: Merri Brunette, MD    History of Present Illness:    Jeffery [name] is a 65 y.o. male with a hx of coronary calcification on CT chest, HTN, HLD, nonischemic cardiomyopathy with LVEF 39% on cMR, and frequent PVCs on cardiac monitor who now presents to clinic for follow-up for his newly diagnosed cardiomyopathy.  During last visit on 12/01/20, he was overall feeling well from a CV standpoint. ECG showed LVH with PVCs which prompted his initial referral. We obtained a cardiac monitor on 12/16/20 which showed 245 runs of nonsustained VT 5-7 beats; 2 runs of SVT (nonsustained), frequent PVCs (7.1%), no afib or sustained arrhythmias. TTE with LVEF 50-55%, G1DD, normal RV, dailated aortic root 75mm, RAP . Lexiscan with small fixed defect in the mid-to-apical inferior location. CMR with LVEF 39% no LGE, normal RV, suspected nonischemic CM.  Coronary Calcium Score: Left Main: 0; LAD: 80; LCx: 24; RCA: 93; Total Agatston Score: 198 (82nd percentile for patient's age, gender and race/ethnicity).  Today, the patient feels well. No chest pain, SOB, LE edema, orthopnea, lightheadedness, and dizziness. Has occasional fluttering in the chest, but this is not overly bothersome. He is tolerating his medications and blood pressure is well controlled at home.   Past Medical History:  Diagnosis Date  . Cough due to ACE inhibitor   . Hypertension   . Internal hemorrhoid     Past Surgical History:  Procedure Laterality Date  . BIOPSY PROSTATE      Current Medications: Current Meds  Medication Sig  . aspirin EC 81 MG tablet Take 1 tablet (81 mg total) by mouth daily. Swallow whole.  . carvedilol (COREG) 6.25 MG  tablet Take 1 tablet (6.25 mg total) by mouth 2 (two) times daily.  . Multiple Vitamin (MULTIVITAMIN) tablet Take 1 tablet by mouth daily.  . rosuvastatin (CRESTOR) 20 MG tablet Take 20 mg by mouth at bedtime.  . sacubitril-valsartan (ENTRESTO) 24-26 MG Take 1 tablet by mouth 2 (two) times daily.  . [DISCONTINUED] amLODipine (NORVASC) 5 MG tablet Take 5 mg by mouth daily.  . [DISCONTINUED] losartan-hydrochlorothiazide (HYZAAR) 100-25 MG tablet Take 1 tablet by mouth daily.     Allergies:   Patient has no known allergies.   Social History   Socioeconomic History  . Marital status: Single    Spouse name: Not on file  . Number of children: Not on file  . Years of education: Not on file  . Highest education level: Not on file  Occupational History  . Not on file  Tobacco Use  . Smoking status: Never Smoker  . Smokeless tobacco: Never Used  Substance and Sexual Activity  . Alcohol use: Yes    Comment: 3-4 drinks a day  . Drug use: Never  . Sexual activity: Not on file  Other Topics Concern  . Not on file  Social History Narrative  . Not on file   Social Determinants of Health   Financial Resource Strain: Not on file  Food Insecurity: Not on file  Transportation Needs: Not on file  Physical Activity: Not on file  Stress: Not on file  Social Connections: Not on file     Family History:  The patient's family history includes Aneurysm in his brother and father; Kidney disease in his father.  ROS:   Please see the history of present illness.    Review of Systems  Constitutional: Negative for chills and fever.  HENT: Negative for hearing loss.   Eyes: Negative for blurred vision.  Respiratory: Positive for cough. Negative for shortness of breath.   Cardiovascular: Positive for palpitations. Negative for chest pain, orthopnea, claudication, leg swelling and PND.  Gastrointestinal: Negative for nausea and vomiting.  Genitourinary: Negative for hematuria.  Musculoskeletal:  Negative for myalgias.  Neurological: Negative for dizziness and focal weakness.  Psychiatric/Behavioral: Negative for substance abuse.    EKGs/Labs/Other Studies Reviewed:    The following studies were reviewed today: CMR 15-Feb-2021: IMPRESSION: 1.  Technically difficult study due to respiratory artifact.  2.   Mildly dilated left ventricle with diffuse hypokinesis, EF 39%.  3.  Normal RV size with EF 45%.  4. No myocardial LGE, so no definitive evidence for prior infarct, myocarditis, or infiltrative disease.  5.  Mildly elevated ECV percentage is nonspecific.  Suspect nonischemic cardiomyopathy.  Myoview 01/13/21:  Nuclear stress EF: 41%.  The left ventricular ejection fraction is moderately decreased (30-44%).  There was no ST segment deviation noted during stress.  Findings consistent with prior myocardial infarction.  This is an intermediate risk study.   There is a small, fixed defect of mild severity present in the mid inferior and apical inferior location, suggestive of prior infarct. There is also mild-moderately reduced LV ejection fraction and severe enlargement of LV end diastolic volume. Reduction in LVEF may be out of proportion to the degree of perfusion defect. Findings suggest a mixed cardiomyopathic process.  TTE 12/24/20: IMPRESSIONS    1. Left ventricular ejection fraction, by estimation, is 50 to 55%. The  left ventricle has low normal function. The left ventricle has no regional  wall motion abnormalities. The left ventricular internal cavity size was  mildly dilated. Left ventricular  diastolic parameters are consistent with Grade I diastolic dysfunction  (impaired relaxation).  2. Right ventricular systolic function is normal. The right ventricular  size is normal.  3. The mitral valve is normal in structure. Mild mitral valve  regurgitation. No evidence of mitral stenosis.  4. The aortic valve has an indeterminant number of cusps. Aortic  valve  regurgitation is not visualized. No aortic stenosis is present.  5. Aortic dilatation noted. There is borderline dilatation of the aortic  root, measuring 38 mm.  6. The inferior vena cava is normal in size with greater than 50%  respiratory variability, suggesting right atrial pressure of 3 mmHg.   Cardiac Monitor 12/16/20:  Patch wear time was 6 days and 22 hours  Predominant rhythm was NSR with 1 degree AVB. Average HR 84bpm; ranged from 54-210bpm.  Had 245 runs of NSVT with longest episode lasting 7 beats (102bpm); fastest 5 beats at rate 210bpm  2 runs of SVT with fastest interval lasting 8 beats with max rate 179bpm; longest 9 beats with rate 139bpm  Frequent PVCs (7.1%, 58955); rare SVE  No Afib, significant pauses, sustained arrhythmias    Patch Wear Time:  6 days and 22 hours (2022-03-04T19:20:12-498 to 2022-03-11T17:33:11-498)  Patient had a min HR of 54 bpm, max HR of 210 bpm, and avg HR of 84 bpm. Predominant underlying rhythm was Sinus Rhythm. First Degree AV Block was present. 245 Ventricular Tachycardia runs occurred, the run with the fastest interval lasting 5 beats with  a max  rate of 210 bpm, the longest lasting 7 beats with an avg rate of 102 bpm. 2 Supraventricular Tachycardia runs occurred, the run with the fastest interval lasting 8 beats with a max rate of 179 bpm, the longest lasting 9 beats with an avg rate of  139 bpm. Isolated SVEs were rare (<1.0%), SVE Couplets were rare (<1.0%), and SVE Triplets were rare (<1.0%). Isolated VEs were frequent (7.1%, 58955), VE Couplets were occasional (1.1%, 4754), and VE Triplets were rare (<1.0%, 616). Ventricular Bigeminy  and Trigeminy were present.   CT Calcium 11/19/20 FINDINGS: CORONARY CALCIUM SCORES:  Left Main: 0  LAD: 80  LCx: 24  RCA: 93  Total Agatston Score: 198  MESA database percentile: 82  AORTA MEASUREMENTS:  Ascending Aorta: 37 mm  Descending Aorta: 30 mm  OTHER  FINDINGS:  Cardiovascular: Coronary artery calcium as described. Heart size is top normal without pericardial effusion  Mediastinum/Nodes: No adenopathy in the mediastinum. Limited visualization of the mediastinum. Mildly patulous esophagus.  Lungs/Pleura: Airways are patent, visualized airways. Lungs with respect to visualized portions of the chest are clear.  Upper Abdomen: Incidental imaging of upper abdominal contents is unremarkable.  Musculoskeletal: No acute bone finding or destructive bone process. Spinal degenerative changes.  IMPRESSION: Coronary artery calcium score of 198. This places the patient in the MESA database 82nd percentile for patient's age, gender and race/ethnicity.  3.7 cm ascending thoracic aortic dilation. Recommend annual imaging followup by CTA or MRA. This recommendation follows 2010 ACCF/AHA/AATS/ACR/ASA/SCA/SCAI/SIR/STS/SVM Guidelines for the Diagnosis and Management of Patients with Thoracic Aortic Disease. Circulation.2010; 121: O962-X528. Aortic aneurysm NOS (ICD10-I71.9)  EKG:  EKG is  ordered today.  The ekg ordered today demonstrates NSR with LVH with occasional PVC, HR 71bpm  Recent Labs: No results found for requested labs within last 8760 hours.  Recent Lipid Panel No results found for: CHOL, TRIG, HDL, CHOLHDL, VLDL, LDLCALC, LDLDIRECT   Physical Exam:    VS:  BP 128/78   Pulse 76   Ht 5\' 8"  (1.727 m)   Wt 194 lb 9.6 oz (88.3 kg)   SpO2 96%   BMI 29.59 kg/m     Wt Readings from Last 3 Encounters:  02/17/21 194 lb 9.6 oz (88.3 kg)  01/13/21 200 lb (90.7 kg)  12/01/20 200 lb 6.4 oz (90.9 kg)     GEN:  Well nourished, well developed in no acute distress HEENT: Normal NECK: No JVD; No carotid bruits CARDIAC: RRR, no murmurs RESPIRATORY:  CTAB, no wheezing or rhonchi  ABDOMEN: Soft, non-tender, non-distended MUSCULOSKELETAL:  No edema; No deformity  SKIN: Warm and dry NEUROLOGIC:  Alert and oriented x  3 PSYCHIATRIC:  Normal affect   ASSESSMENT:    1. Nonischemic cardiomyopathy (HCC)   2. Medication management   3. Mixed hyperlipidemia   4. Primary hypertension   5. Low left ventricular ejection fraction   6. PVC's (premature ventricular contractions)   7. Coronary artery calcification   8. Ventricular tachycardia (HCC)    PLAN:    In order of problems listed above:  #Nonischemic CM: #Newly Diagnosed Systolic Heart Failure: LVEF 39% on cardiac MRI on 02/11/21 with no LGE suggesting nonischemic etiology. Myoview with inferior defect but may be artifact due to diaphragmatic attenuation. Currently euvolemic with NYHA class II symptoms. -Start entresto 24/26mg  BID -Start coreg 6.25mg  BID -Stop losartan-HCTZ -Stop amlodipine to allow blood pressure room for above medications -Will add spiro and farxiga as tolerated; plan to follow-up in HTN clinic for medication titration -  Low Na diet  #Coronary calcification: Coronary Calcium Score: 198 (82nd percentile for patient's age, gender and race/ethnicity) Left Main: 0; LAD: 66; LCx: 24; RCA: 52. Patient asymptomatic. No LGE on CMR to suggest scar/ischemia. Likely defect seen on myoview was artifact over infarct. -Continue ASA 81mg  daily -Continue crestor 20mg  daily -Start coreg 6.25mg  BID -Diet and lifestyle modifications discussed at length as detailed below  #HTN: -Start entresto 24/26mg BID -Coreg 6.25mg  BID -Stop losartan-HCTZ and amlodipine as above -BMET next week -Will follow-up in HTN clinic with goal of adding GDMT as tolerated for HFrEF  #HLD: -Continue crestor 20mg  daily    Medication Adjustments/Labs and Tests Ordered: Current medicines are reviewed at length with the patient today.  Concerns regarding medicines are outlined above.  Orders Placed This Encounter  Procedures  . Basic metabolic panel  . AMB Referral to [facility]   Meds ordered this encounter  Medications  . sacubitril-valsartan (ENTRESTO)  24-26 MG    Sig: Take 1 tablet by mouth 2 (two) times daily.    Dispense:  60 tablet    Refill:  1  . carvedilol (COREG) 6.25 MG tablet    Sig: Take 1 tablet (6.25 mg total) by mouth 2 (two) times daily.    Dispense:  180 tablet    Refill:  1    Patient Instructions  Medication Instructions:   STOP TAKING AMLODIPINE NOW  STOP TAKING HYZAAR NOW  START TAKING ENTRESTO 24-26 MG STRENGTH--TAKE ONE TABLET BY MOUTH TWICE DAILY  START TAKING CARVEDILOL (COREG) 6.25 MG BY MOUTH TWICE DAILY  *If you need a refill on your cardiac medications before your next appointment, please call your pharmacy*   Lab Work:  IN 7-10 DAYS HERE IN THE OFFICE--BMET  If you have labs (blood work) drawn today and your tests are completely normal, you will receive your results only by: Marland Kitchen MyChart Message (if you have MyChart) OR . A paper copy in the mail If you have any lab test that is abnormal or we need to change your treatment, we will call you to review the results.   You have been referred to OUR [facility]ION [facility] TO SEE OUR PHARMACIST IN 3 WEEKS FOR MANAGEMENT AND TITRATION OF EXTRESTO   Follow-Up:  1.) 3 WEEKS IN BP CLINIC TO SEE PHARMACIST FOR MANAGEMENT OF ENTRESTO PER DR. Shari Prows  2.) 3 MONTHS IN THE OFFICE WITH DR. Shari Prows          Signed, Meriam Sprague, MD  02/17/2021 10:51 AM    [facility]

## 2021-02-17 ENCOUNTER — Encounter: Payer: Self-pay | Admitting: Cardiology

## 2021-02-17 ENCOUNTER — Other Ambulatory Visit: Payer: Self-pay

## 2021-02-17 ENCOUNTER — Ambulatory Visit: Payer: Medicare Other | Admitting: Cardiology

## 2021-02-17 VITALS — BP 128/78 | HR 76 | Ht 68.0 in | Wt 194.6 lb

## 2021-02-17 DIAGNOSIS — I2584 Coronary atherosclerosis due to calcified coronary lesion: Secondary | ICD-10-CM | POA: Diagnosis not present

## 2021-02-17 DIAGNOSIS — Z79899 Other long term (current) drug therapy: Secondary | ICD-10-CM | POA: Diagnosis not present

## 2021-02-17 DIAGNOSIS — I10 Essential (primary) hypertension: Secondary | ICD-10-CM | POA: Diagnosis not present

## 2021-02-17 DIAGNOSIS — R9430 Abnormal result of cardiovascular function study, unspecified: Secondary | ICD-10-CM | POA: Diagnosis not present

## 2021-02-17 DIAGNOSIS — I493 Ventricular premature depolarization: Secondary | ICD-10-CM

## 2021-02-17 DIAGNOSIS — I2510 Atherosclerotic heart disease of native coronary artery without angina pectoris: Secondary | ICD-10-CM | POA: Diagnosis not present

## 2021-02-17 DIAGNOSIS — I428 Other cardiomyopathies: Secondary | ICD-10-CM

## 2021-02-17 DIAGNOSIS — E782 Mixed hyperlipidemia: Secondary | ICD-10-CM

## 2021-02-17 DIAGNOSIS — I472 Ventricular tachycardia, unspecified: Secondary | ICD-10-CM

## 2021-02-17 MED ORDER — CARVEDILOL 6.25 MG PO TABS: 6.2500 mg | ORAL_TABLET | Freq: Two times a day (BID) | ORAL | 1 refills | Status: DC

## 2021-02-17 MED ORDER — ENTRESTO 24-26 MG PO TABS
1.0000 | ORAL_TABLET | Freq: Two times a day (BID) | ORAL | 1 refills | Status: DC
Start: 2021-02-17 — End: 2021-03-22

## 2021-02-17 NOTE — Patient Instructions (Addendum)
Medication Instructions:   STOP TAKING AMLODIPINE NOW  STOP TAKING HYZAAR NOW  START TAKING ENTRESTO 24-26 MG STRENGTH--TAKE ONE TABLET BY MOUTH TWICE DAILY  START TAKING CARVEDILOL (COREG) 6.25 MG BY MOUTH TWICE DAILY  *If you need a refill on your cardiac medications before your next appointment, please call your pharmacy*   Lab Work:  IN 7-10 DAYS HERE IN THE OFFICE--BMET  If you have labs (blood work) drawn today and your tests are completely normal, you will receive your results only by: Marland Kitchen MyChart Message (if you have MyChart) OR . A paper copy in the mail If you have any lab test that is abnormal or we need to change your treatment, we will call you to review the results.   You have been referred to OUR [facility] TO SEE OUR PHARMACIST IN 3 WEEKS FOR MANAGEMENT AND TITRATION OF EXTRESTO   Follow-Up:  1.) 3 WEEKS IN BP CLINIC TO SEE PHARMACIST FOR MANAGEMENT OF ENTRESTO PER DR. Shari Prows  2.) 3 MONTHS IN THE OFFICE WITH DR. Shari Prows

## 2021-02-24 ENCOUNTER — Other Ambulatory Visit: Payer: Self-pay

## 2021-02-24 ENCOUNTER — Other Ambulatory Visit: Payer: Medicare Other

## 2021-02-24 DIAGNOSIS — I10 Essential (primary) hypertension: Secondary | ICD-10-CM | POA: Diagnosis not present

## 2021-02-24 DIAGNOSIS — Z79899 Other long term (current) drug therapy: Secondary | ICD-10-CM | POA: Diagnosis not present

## 2021-02-24 DIAGNOSIS — E782 Mixed hyperlipidemia: Secondary | ICD-10-CM | POA: Diagnosis not present

## 2021-02-24 DIAGNOSIS — R9430 Abnormal result of cardiovascular function study, unspecified: Secondary | ICD-10-CM

## 2021-02-25 LAB — BASIC METABOLIC PANEL
BUN/Creatinine Ratio: 15 (ref 10–24)
BUN: 16 mg/dL (ref 8–27)
CO2: 19 mmol/L — ABNORMAL LOW (ref 20–29)
Calcium: 8.9 mg/dL (ref 8.6–10.2)
Chloride: 109 mmol/L — ABNORMAL HIGH (ref 96–106)
Creatinine, Ser: 1.06 mg/dL (ref 0.76–1.27)
Glucose: 89 mg/dL (ref 65–99)
Potassium: 4 mmol/L (ref 3.5–5.2)
Sodium: 143 mmol/L (ref 134–144)
eGFR: 77 mL/min/{1.73_m2} (ref >59)

## 2021-03-01 DIAGNOSIS — I129 Hypertensive chronic kidney disease with stage 1 through stage 4 chronic kidney disease, or unspecified chronic kidney disease: Secondary | ICD-10-CM | POA: Diagnosis not present

## 2021-03-01 DIAGNOSIS — E039 Hypothyroidism, unspecified: Secondary | ICD-10-CM | POA: Diagnosis not present

## 2021-03-01 DIAGNOSIS — N1831 Chronic kidney disease, stage 3a: Secondary | ICD-10-CM | POA: Diagnosis not present

## 2021-03-09 ENCOUNTER — Ambulatory Visit (INDEPENDENT_AMBULATORY_CARE_PROVIDER_SITE_OTHER): Payer: Medicare Other | Admitting: Pharmacist

## 2021-03-09 ENCOUNTER — Other Ambulatory Visit: Payer: Self-pay

## 2021-03-09 VITALS — BP 152/68 | HR 76 | Wt 194.0 lb

## 2021-03-09 DIAGNOSIS — I5022 Chronic systolic (congestive) heart failure: Secondary | ICD-10-CM | POA: Diagnosis not present

## 2021-03-09 DIAGNOSIS — I493 Ventricular premature depolarization: Secondary | ICD-10-CM | POA: Insufficient documentation

## 2021-03-09 DIAGNOSIS — I2510 Atherosclerotic heart disease of native coronary artery without angina pectoris: Secondary | ICD-10-CM | POA: Insufficient documentation

## 2021-03-09 DIAGNOSIS — R9430 Abnormal result of cardiovascular function study, unspecified: Secondary | ICD-10-CM

## 2021-03-09 DIAGNOSIS — I10 Essential (primary) hypertension: Secondary | ICD-10-CM

## 2021-03-09 DIAGNOSIS — R7303 Prediabetes: Secondary | ICD-10-CM | POA: Insufficient documentation

## 2021-03-09 DIAGNOSIS — Z6829 Body mass index (BMI) 29.0-29.9, adult: Secondary | ICD-10-CM | POA: Insufficient documentation

## 2021-03-09 DIAGNOSIS — N400 Enlarged prostate without lower urinary tract symptoms: Secondary | ICD-10-CM | POA: Insufficient documentation

## 2021-03-09 DIAGNOSIS — Z79899 Other long term (current) drug therapy: Secondary | ICD-10-CM | POA: Diagnosis not present

## 2021-03-09 DIAGNOSIS — E782 Mixed hyperlipidemia: Secondary | ICD-10-CM | POA: Diagnosis not present

## 2021-03-09 MED ORDER — SPIRONOLACTONE 25 MG PO TABS: 25.0000 mg | ORAL_TABLET | Freq: Every day | ORAL | 11 refills | Status: DC

## 2021-03-09 MED ORDER — CARVEDILOL 12.5 MG PO TABS: 12.5000 mg | ORAL_TABLET | Freq: Two times a day (BID) | ORAL | 5 refills | Status: DC

## 2021-03-09 NOTE — Patient Instructions (Addendum)
Increase your carvedilol from 6.25mg  to 12.5mg  twice daily. You can double up and take 2 of your current tablets twice a day until you run out.  Start taking spironolactone 25mg  - 1 tablet once daily  Continue Entresto 24-26mg  - 1 tablet twice a day  I will submit patient assistance forms for Entresto and either [name] or Farxiga to your insurance   Continue to monitor your blood pressure at home  Recheck lab work in 1 week on Friday, June 17th. Come in fasting any time after 7:30am

## 2021-03-09 NOTE — Progress Notes (Signed)
Patient ID: Jeffery [name]                 DOB: 09-Jun-1956                      MRN: [id number]     HPI: Jeffery [name] is a 66 y.o. male referred by Dr. Shari Prows to pharmacy clinic for HF medication management. PMH is significant for coronary calcifications on chest CT 11/2020 (calcium score of 198 - 82nd percentile for age, gender, and race), HTN, HLD, nonischemic cardiomyopathy with LVEF 39% on cardiac MRI 02/11/21, and frequent PVCs. He was seen by Dr Shari Prows on 02/17/21; losartan-HCTZ and amlodipine were stopped, and Entresto and carvedilol were started due to his new dx of CHF. F/u BMET 1 week later was stable.  Today he returns to pharmacy clinic for further medication titration. Pt reports feeling well and denies dizziness, LE edema, headache, and SOB. BP readings have increased over the past few weeks at home and have been running 140-145 systolic mostly. He thinks he started rosuvastatin in March after his labs were checked in January (LDL 171 at that time).  Current CHF meds:  Carvedilol 6.25mg  BID - 8am, 6pm Entresto 24-26mg  BID - 8am, 6pm  Previously tried: ACEi cough  BP goal: <130/29mmHg  Family History: Aneurysm in his brother and father; Kidney disease in his father.  Social History: Denies tobacco and drug use, 3-4 alcoholic drinks per day.  Diet: trying to cut out fried food, down to about once a week. Eating more fish.   Exercise: on his feet for work, does yard work  Home BP readings: 135-150 systolic  Wt Readings from Last 3 Encounters:  02/17/21 194 lb 9.6 oz (88.3 kg)  01/13/21 200 lb (90.7 kg)  12/01/20 200 lb 6.4 oz (90.9 kg)   BP Readings from Last 3 Encounters:  02/17/21 128/78  12/01/20 136/90   Pulse Readings from Last 3 Encounters:  02/17/21 76  12/01/20 71    Renal function: CrCl cannot be calculated (Unknown ideal weight.).  Past Medical History:  Diagnosis Date  . Cough due to ACE inhibitor   . Hypertension   . Internal hemorrhoid      Current Outpatient Medications on File Prior to Visit  Medication Sig Dispense Refill  . aspirin EC 81 MG tablet Take 1 tablet (81 mg total) by mouth daily. Swallow whole. 90 tablet 3  . carvedilol (COREG) 6.25 MG tablet Take 1 tablet (6.25 mg total) by mouth 2 (two) times daily. 180 tablet 1  . Multiple Vitamin (MULTIVITAMIN) tablet Take 1 tablet by mouth daily.    . rosuvastatin (CRESTOR) 20 MG tablet Take 20 mg by mouth at bedtime.    . sacubitril-valsartan (ENTRESTO) 24-26 MG Take 1 tablet by mouth 2 (two) times daily. 60 tablet 1   No current facility-administered medications on file prior to visit.    No Known Allergies   Assessment/Plan:  1. CHF with LVEF 39% - BP elevated above goal <130/63mmHg with room to further optimize CHF regimen. Will increase carvedilol to 12.5mg  BID, goal to target HR closer to 60. Will start spironolactone 25mg  daily for both BP and CHF and recheck BMET in 1 week. Pt advised to space apart BID meds by 12 hours. Will continue on Entresto 24-26mg  BID for now, 2 wks of samples given. I have filled out pt assistance as his copay will be $47 but he has a $435 deductible to meet first which is cost  prohibitive. This will also affect SGLT2i therapy so will fill out pt assistance for [name] now as well with plans to start at next visit if approved.  2. Hyperlipidemia - LDL goal < 70 due to CAD. LDL was 171 back in January, pt has started on rosuvastatin 20mg  daily since then. Will check fasting lipid panel next week when pt comes in for BMET.  Will call pt once pt assistance returns and once labs result in 1 week for further med management.  Jeffery [name] E. Rosalind Guido, PharmD, BCACP, CPP [facility] [address] [place], [place] Phone: [phone]; Fax: [phone] 03/09/2021 3:04 PM

## 2021-03-10 ENCOUNTER — Telehealth: Payer: Self-pay | Admitting: Pharmacist

## 2021-03-10 NOTE — Telephone Encounter (Signed)
Pt assistance forms for [name] 10mg  daily and Entresto 49-51mg  BID  (plan to titrate dose, currently on 24-26mg  BID dosing) have been submitted to manufacturers.

## 2021-03-16 NOTE — Telephone Encounter (Signed)
**Note De-Identified [name] Obfuscation** Letter received Jeffery [name] fax from [employer] stating that they approved the pt for assistance with her [name] for the remainder of this year (2022). ID: [id number]. The letter states that they have notified the pt of this approval as well.

## 2021-03-17 NOTE — Telephone Encounter (Signed)
Will wait for follow up labs tomorrow before calling pt (still on Entresto 24-26mg  BID but pt assistance submitted and approved for 49-51mg  BID - want to ensure labs are stable first).

## 2021-03-18 ENCOUNTER — Other Ambulatory Visit: Payer: Self-pay

## 2021-03-18 ENCOUNTER — Other Ambulatory Visit: Payer: Medicare Other | Admitting: *Deleted

## 2021-03-18 DIAGNOSIS — E782 Mixed hyperlipidemia: Secondary | ICD-10-CM | POA: Diagnosis not present

## 2021-03-18 DIAGNOSIS — R9430 Abnormal result of cardiovascular function study, unspecified: Secondary | ICD-10-CM

## 2021-03-18 DIAGNOSIS — R3911 Hesitancy of micturition: Secondary | ICD-10-CM | POA: Diagnosis not present

## 2021-03-18 LAB — COMPREHENSIVE METABOLIC PANEL
ALT: 19 IU/L (ref 0–44)
AST: 17 IU/L (ref 0–40)
Albumin/Globulin Ratio: 2.1 (ref 1.2–2.2)
Albumin: 4.5 g/dL (ref 3.8–4.8)
Alkaline Phosphatase: 82 IU/L (ref 44–121)
BUN/Creatinine Ratio: 16 (ref 10–24)
BUN: 18 mg/dL (ref 8–27)
Bilirubin Total: 0.4 mg/dL (ref 0.0–1.2)
CO2: 20 mmol/L (ref 20–29)
Calcium: 9.2 mg/dL (ref 8.6–10.2)
Chloride: 105 mmol/L (ref 96–106)
Creatinine, Ser: 1.12 mg/dL (ref 0.76–1.27)
Globulin, Total: 2.1 g/dL (ref 1.5–4.5)
Glucose: 109 mg/dL — ABNORMAL HIGH (ref 65–99)
Potassium: 4.3 mmol/L (ref 3.5–5.2)
Sodium: 139 mmol/L (ref 134–144)
Total Protein: 6.6 g/dL (ref 6.0–8.5)
eGFR: 72 mL/min/{1.73_m2} (ref >59)

## 2021-03-18 LAB — LIPID PANEL
Chol/HDL Ratio: 2.5 ratio (ref 0.0–5.0)
Cholesterol, Total: 118 mg/dL (ref 100–199)
HDL: 47 mg/dL (ref >39)
LDL Chol Calc (NIH): 57 mg/dL (ref 0–99)
Triglycerides: 69 mg/dL (ref 0–149)
VLDL Cholesterol Cal: 14 mg/dL (ref 5–40)

## 2021-03-18 NOTE — Telephone Encounter (Addendum)
Pt presented today for labs and dropped off approval Entresto letter as well as a letter regarding [place] that his application was not processed because they were missing proof of income. Pt dropped off proof of income, this has been faxed to [facility] pt assistance. Called pt to let him know this update, no answer. Will call back once labs result.

## 2021-03-21 NOTE — Telephone Encounter (Signed)
Received fax from [name] cares that patient did not meet income requirement for patient assistance for [name]. Patient made aware. Follow up scheduled in office on 7/5 I advised I will have [name] call him tomorrow when she is back in the office.

## 2021-03-22 NOTE — Telephone Encounter (Signed)
Called pt, spoke with his wife. She is aware of [name] denial and Entresto approval through pt assistance. Pt has not been contacted regarding med delivery yet. Provided her with [employer] pt assistance #[phone] to call to coordinate delivery of Entresto. She is aware that higher dose of Entresto 49-51mg  BID will be delivered since BP and BMET were stable, and pt will start on higher dose once med is delivered. He's advised to monitor BP at home and will recheck BMET at next PharmD appt on 7/5. Lipids also well controlled since starting rosuvastatin 20mg  daily, with excellent improvement in LDL of 66% from 171 to 57.

## 2021-03-22 NOTE — Addendum Note (Signed)
Addended by: Nesbit Michon E on: 03/22/2021 08:36 AM   Modules accepted: Orders

## 2021-03-31 DIAGNOSIS — I129 Hypertensive chronic kidney disease with stage 1 through stage 4 chronic kidney disease, or unspecified chronic kidney disease: Secondary | ICD-10-CM | POA: Diagnosis not present

## 2021-03-31 DIAGNOSIS — E7800 Pure hypercholesterolemia, unspecified: Secondary | ICD-10-CM | POA: Diagnosis not present

## 2021-03-31 DIAGNOSIS — N1831 Chronic kidney disease, stage 3a: Secondary | ICD-10-CM | POA: Diagnosis not present

## 2021-03-31 DIAGNOSIS — E039 Hypothyroidism, unspecified: Secondary | ICD-10-CM | POA: Diagnosis not present

## 2021-04-01 ENCOUNTER — Telehealth: Payer: Self-pay | Admitting: Cardiology

## 2021-04-01 DIAGNOSIS — I10 Essential (primary) hypertension: Secondary | ICD-10-CM

## 2021-04-01 DIAGNOSIS — E782 Mixed hyperlipidemia: Secondary | ICD-10-CM

## 2021-04-01 DIAGNOSIS — R9430 Abnormal result of cardiovascular function study, unspecified: Secondary | ICD-10-CM

## 2021-04-01 DIAGNOSIS — Z79899 Other long term (current) drug therapy: Secondary | ICD-10-CM

## 2021-04-01 NOTE — Telephone Encounter (Signed)
*  STAT* If patient is at the pharmacy, call can be transferred to refill team.   1. Which medications need to be refilled? (please list name of each medication and dose if known) carvedilol (COREG) 12.5 MG tablet; spironolactone (ALDACTONE) 25 MG tablet  2. Which pharmacy/location (including street and city if local pharmacy) is medication to be sent to?  [facility] - [place], [place] - [place] [id number]  3. Do they need a 30 day or 90 day supply? 90

## 2021-04-05 ENCOUNTER — Ambulatory Visit (INDEPENDENT_AMBULATORY_CARE_PROVIDER_SITE_OTHER): Payer: Medicare Other | Admitting: Pharmacist

## 2021-04-05 ENCOUNTER — Other Ambulatory Visit: Payer: Self-pay

## 2021-04-05 VITALS — BP 132/82 | HR 62 | Wt 194.0 lb

## 2021-04-05 DIAGNOSIS — I10 Essential (primary) hypertension: Secondary | ICD-10-CM

## 2021-04-05 DIAGNOSIS — E782 Mixed hyperlipidemia: Secondary | ICD-10-CM

## 2021-04-05 DIAGNOSIS — I5022 Chronic systolic (congestive) heart failure: Secondary | ICD-10-CM

## 2021-04-05 MED ORDER — CARVEDILOL 12.5 MG PO TABS: 12.5000 mg | ORAL_TABLET | Freq: Two times a day (BID) | ORAL | 3 refills | Status: AC

## 2021-04-05 MED ORDER — SPIRONOLACTONE 25 MG PO TABS: 25.0000 mg | ORAL_TABLET | Freq: Every day | ORAL | 3 refills | Status: AC

## 2021-04-05 NOTE — Telephone Encounter (Signed)
Pt's medication was sent to pt's pharmacy as requested. Confirmation received.  °

## 2021-04-05 NOTE — Progress Notes (Signed)
Patient ID: Jeffery [name]                 DOB: [birth year]                      MRN: [id number]     HPI: Jeffery [name] is a 65 y.o. male referred by Dr. Shari Prows to pharmacy clinic for HF medication management. PMH is significant for coronary calcifications on chest CT 11/2020 (calcium score of 198 - 82nd percentile for age, gender, and race), HTN, HLD, nonischemic cardiomyopathy with LVEF 39% on cardiac MRI 02/11/21, and frequent PVCs. He was seen by Dr Shari Prows on 02/17/21; losartan-HCTZ and amlodipine were stopped, and Entresto and carvedilol were started due to his new dx of CHF. F/u BMET 1 week later was stable.   I saw pt in clinic 03/09/21 and increased pt's carvedilol to 12.5mg  BID and started spironolactone 25mg  daily, f/u BMET stable. Also helped pt fill out patient assistance forms for Entresto and [name] since copays were cost prohibitive. He was approved for [facility] pt assistance but denied for [name] assistance - forms filled out for higher 49-51mg  BID Entresto dosing. Lipids also checked after pt started rosuvastatin 20mg  daily - excellent improvement in LDL of 66% from 171 to 57.  Pt presents today in good spirits. He's still using his old supply of carvedilol 6.25mg  but has been doubling his dose as advised. Did pick up new rx for higher 12.5mg  dose. Has still been using Entresto 24-26mg  BID samples and taking 1 tab BID. Did receive [employer] Entresto supply of 49-51mg  BID but hasn't started using this yet. Tolerating medications well overall. Denies dizziness, headaches, LE edema, SOB. Has been eating more salt with recent holidays. Home BP ~130/80 which is a big improvement from his last visit.  Current CHF meds:  Carvedilol 12.5mg  BID - 8am, 6pm Entresto 24-26mg  BID - 8am, 6pm Spironolactone 25mg  daily - 8am  Previously tried: ACEi cough  BP goal: <130/44mmHg  Family History: Aneurysm in his brother and father; Kidney disease in his father.  Social History: Denies  tobacco and drug use, 3-4 alcoholic drinks per day.  Diet: trying to cut out fried food, down to about once a week. Eating more fish.   Exercise: on his feet for work, does yard work  Home BP readings: 135-150 systolic  Wt Readings from Last 3 Encounters:  03/09/21 194 lb (88 kg)  02/17/21 194 lb 9.6 oz (88.3 kg)  01/13/21 200 lb (90.7 kg)   BP Readings from Last 3 Encounters:  03/09/21 (!) 152/68  02/17/21 128/78  12/01/20 136/90   Pulse Readings from Last 3 Encounters:  03/09/21 76  02/17/21 76  12/01/20 71    Renal function: CrCl cannot be calculated (Unknown ideal weight.).  Past Medical History:  Diagnosis Date   Cough due to ACE inhibitor    Hypertension    Internal hemorrhoid     Current Outpatient Medications on File Prior to Visit  Medication Sig Dispense Refill   aspirin EC 81 MG tablet Take 1 tablet (81 mg total) by mouth daily. Swallow whole. 90 tablet 3   carvedilol (COREG) 12.5 MG tablet Take 1 tablet (12.5 mg total) by mouth 2 (two) times daily. 180 tablet 3   Multiple Vitamin (MULTIVITAMIN) tablet Take 1 tablet by mouth daily.     rosuvastatin (CRESTOR) 20 MG tablet Take 20 mg by mouth at bedtime.     sacubitril-valsartan (ENTRESTO) 49-51 MG Take 1 tablet by mouth  2 (two) times daily.     spironolactone (ALDACTONE) 25 MG tablet Take 1 tablet (25 mg total) by mouth daily. 90 tablet 3   No current facility-administered medications on file prior to visit.    No Known Allergies   Assessment/Plan:  1. CHF with LVEF 39% - BP notably improved from last visit and very close to goal <130/1mmHg with room to further optimize CHF regimen. Checking BMET today with recent spironolactone start. If labs are stable, will plan for pt to increase his Entresto dose to 49-51mg  BID (already has this dose at home from pt assistance, just hadn't started yet). Will continue carvedilol 12.5mg  BID (HR ~60) and spironolactone 25mg  daily. Pt denied assistance for [name] due  to their income cutoff. Will apply for Farxiga pt assistance instead to see if pt is approved for this. Will plan to recheck labs within 2 weeks of Entresto dose increase (~2.5 weeks from now since pt is finishing up samples of Entresto 24-26mg  BID). Will call pt once labs result.  2. Hyperlipidemia - LDL goal < 70 due to CAD. LDL has improved from 171 to 57 after starting rosuvastatin 20mg  daily. Continue current therapy.   Jyl Chico E. Martie Muhlbauer, PharmD, BCACP, CPP  [facility] [address], [place], [address] [place] Phone: [phone]; Fax: [phone] 04/05/2021 12:36 PM

## 2021-04-05 NOTE — Patient Instructions (Signed)
Continue taking carvedilol 12.5mg  twice daily (you can finish your 6.25mg  tablet strength - continue to double up and take 2 tablets twice daily until you run out, then start on the newer prescription and take 1 of the 12.5mg  tablets twice a day)  Start using your Entresto from [employer] when you run out of your samples. This is a higher dose that's a 49-51mg  tablet. Take 1 tablet twice daily  Continue your other medications  I'll submit patient assistance for Marcelline Deist which helps improve the pumping function of your heart (similar medication to the [name] that patient assistance denied coverage for)

## 2021-04-06 ENCOUNTER — Telehealth: Payer: Self-pay | Admitting: Pharmacist

## 2021-04-06 DIAGNOSIS — I5022 Chronic systolic (congestive) heart failure: Secondary | ICD-10-CM

## 2021-04-06 LAB — BASIC METABOLIC PANEL
BUN/Creatinine Ratio: 13 (ref 10–24)
BUN: 17 mg/dL (ref 8–27)
CO2: 21 mmol/L (ref 20–29)
Calcium: 9.4 mg/dL (ref 8.6–10.2)
Chloride: 104 mmol/L (ref 96–106)
Creatinine, Ser: 1.29 mg/dL — ABNORMAL HIGH (ref 0.76–1.27)
Glucose: 85 mg/dL (ref 65–99)
Potassium: 4.4 mmol/L (ref 3.5–5.2)
Sodium: 139 mmol/L (ref 134–144)
eGFR: 61 mL/min/{1.73_m2} (ref >59)

## 2021-04-06 NOTE — Telephone Encounter (Signed)
SCr has bumped slightly from 1.12 to 1.29 since starting spironolactone for CHF, although still stable with SCr of 1.45 from 10/27/20 and 1.2 on 10/22/19 (both labs scanned in). Will continue current meds and advise pt to stay hydrated/avoid NSAIDs. Will still plan to increase Entresto from low to mid dose in another week once he runs out of samples since pt assistance already sent him the 49-51mg  BID dosing. Will keep a close eye on renal function and repeat labs 1 week after dose increase. Left message for pt to discuss results and schedule lab follow up.  Marcelline Deist pt assistance form faxed this AM as well.

## 2021-04-07 NOTE — Telephone Encounter (Signed)
Left 2nd message for pt.

## 2021-04-08 NOTE — Telephone Encounter (Signed)
Spoke with pt, he is aware of results. He started mid-dose Entresto in the last day or two. Scheduled f/u BMET on 7/15. Will let pt know when I hear back regarding Marcelline Deist pt assistance.

## 2021-04-15 ENCOUNTER — Other Ambulatory Visit: Payer: Medicare Other

## 2021-04-19 ENCOUNTER — Telehealth: Payer: Self-pay | Admitting: Pharmacist

## 2021-04-19 NOTE — Telephone Encounter (Signed)
Left message for pt - he missed BMET scheduled for last Friday after increasing his Entresto dose. Labs need to be rescheduled.

## 2021-04-20 ENCOUNTER — Other Ambulatory Visit: Payer: Medicare Other

## 2021-04-20 ENCOUNTER — Other Ambulatory Visit: Payer: Self-pay

## 2021-04-20 ENCOUNTER — Telehealth: Payer: Self-pay | Admitting: Cardiology

## 2021-04-20 DIAGNOSIS — I5022 Chronic systolic (congestive) heart failure: Secondary | ICD-10-CM | POA: Diagnosis not present

## 2021-04-20 LAB — BASIC METABOLIC PANEL
BUN/Creatinine Ratio: 13 (ref 10–24)
BUN: 16 mg/dL (ref 8–27)
CO2: 19 mmol/L — ABNORMAL LOW (ref 20–29)
Calcium: 9.1 mg/dL (ref 8.6–10.2)
Chloride: 104 mmol/L (ref 96–106)
Creatinine, Ser: 1.22 mg/dL (ref 0.76–1.27)
Glucose: 112 mg/dL — ABNORMAL HIGH (ref 65–99)
Potassium: 3.9 mmol/L (ref 3.5–5.2)
Sodium: 139 mmol/L (ref 134–144)
eGFR: 65 mL/min/{1.73_m2} (ref >59)

## 2021-04-20 NOTE — Telephone Encounter (Signed)
    Pt c/o medication issue:  1. Name of Medication: entresto  2. How are you currently taking this medication (dosage and times per day)?   3. Are you having a reaction (difficulty breathing--STAT)?   4. What is your medication issue? Pt would like to speak with Aundra Millet about this medication, he said he has a lot of medications now and I don't think he wants to take another one

## 2021-04-20 NOTE — Telephone Encounter (Signed)
Called pt back. He received a call about approval for a new medication. This is [place] for his HF, he does not have questions about his Entresto. Discussed the cardiac benefit of each of the medications he takes. He is agreeable to add [place] 10mg  daily for his CHF. Also discussed CKD and CAD benefit as well. He will continue his other medications and keep follow up appt with Dr next month. Can increase Entresto to max dose at that time if BP allows. Would need new rx sent in to patient assistance with [employer] as he is uninsured and receives all of his branded medications through pt assistance programs.

## 2021-04-22 ENCOUNTER — Encounter: Payer: Self-pay | Admitting: Cardiology

## 2021-05-01 DIAGNOSIS — I129 Hypertensive chronic kidney disease with stage 1 through stage 4 chronic kidney disease, or unspecified chronic kidney disease: Secondary | ICD-10-CM | POA: Diagnosis not present

## 2021-05-01 DIAGNOSIS — E039 Hypothyroidism, unspecified: Secondary | ICD-10-CM | POA: Diagnosis not present

## 2021-05-01 DIAGNOSIS — N1831 Chronic kidney disease, stage 3a: Secondary | ICD-10-CM | POA: Diagnosis not present

## 2021-05-01 DIAGNOSIS — E7800 Pure hypercholesterolemia, unspecified: Secondary | ICD-10-CM | POA: Diagnosis not present

## 2021-05-11 ENCOUNTER — Ambulatory Visit: Payer: Medicare Other | Admitting: Cardiology

## 2021-05-16 ENCOUNTER — Ambulatory Visit: Payer: Medicare Other | Admitting: Cardiology

## 2021-05-23 NOTE — Progress Notes (Signed)
Cardiology Office Note:    Date:  05/24/2021   ID:  Davyon Fisch, DOB 11/14/1955, MRN [id number]  PCP:  Merri Brunette, MD   [facility] Providers Cardiologist:  Meriam Sprague, MD      Referring MD: Merri Brunette, MD   Follow-up for chronic systolic CHF and essential hypertension  History of Present Illness:    Jeffery [name] is a 65 y.o. male with a hx of hypertension, hyperlipidemia, nonischemic cardiomyopathy with LVEF 39% on cardiac MRI 5/22, PVCs, and coronary artery disease identified on chest CT 2/22-calcium score 198.  Echocardiogram 12/24/2020 LVEF 50-55%, G1 DD, and mild mitral valve regurgitation.  Her losartan-HCTZ and amlodipine were stopped by Dr. Shari Prows on 02/17/2021.  She was placed on Entresto and carvedilol due to her new diagnosis of CHF.  Her Bumex 1 week later was stable.  She was seen by clinical pharmacist on 03/09/2021 and again on 04/05/2021.  She had been approved for [facility] patient assistance but was denied for [place] assistance.  She was noted to have significant improvement in her LDL cholesterol from 171 down to 57 on rosuvastatin 20 mg daily.  She was seen in the clinic on 04/05/2021 and reported that she was compliant with her carvedilol.  She was only taking 24-26 twice daily of her Entresto.  She had not started her 49-51 dosing yet but had received the medication.  She was tolerating her medications well.  She denied dizziness, headaches, lower extremity swelling, and shortness of breath.  She reported that she had some dietary indiscretion.  Her home blood pressure was 130/80.  She presents to the clinic today for follow-up evaluation and states he feels well.  He continues to be very physically active.  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  He is also doing yard work with a Firefighter, and is the primary caregiver for his wife who is handicapped.  He has been monitoring his blood pressure  and heart rate.  He continues to monitor his salt intake.  We reviewed his Entresto medication which I will increase to the 97 103 dosing.  We will order a BMP in 1 week and have him follow-up in 1 month.  Today he denies chest pain, shortness of breath, lower extremity edema, fatigue, palpitations, melena, hematuria, hemoptysis, diaphoresis, weakness, presyncope, syncope, orthopnea, and PND.    Past Medical History:  Diagnosis Date   Cough due to ACE inhibitor    Hypertension    Internal hemorrhoid     Past Surgical History:  Procedure Laterality Date   BIOPSY PROSTATE      Current Medications: Current Meds  Medication Sig   aspirin EC 81 MG tablet Take 1 tablet (81 mg total) by mouth daily. Swallow whole.   carvedilol (COREG) 12.5 MG tablet Take 1 tablet (12.5 mg total) by mouth 2 (two) times daily.   dapagliflozin propanediol (FARXIGA) 10 MG TABS tablet Take 10 mg by mouth daily.   Multiple Vitamin (MULTIVITAMIN) tablet Take 1 tablet by mouth daily.   rosuvastatin (CRESTOR) 20 MG tablet Take 20 mg by mouth at bedtime.   spironolactone (ALDACTONE) 25 MG tablet Take 1 tablet (25 mg total) by mouth daily.   [DISCONTINUED] sacubitril-valsartan (ENTRESTO) 49-51 MG Take 1 tablet by mouth 2 (two) times daily.     Allergies:   Patient has no known allergies.   Social History   Socioeconomic History   Marital status: Single  Spouse name: Not on file   Number of children: Not on file   Years of education: Not on file   Highest education level: Not on file  Occupational History   Not on file  Tobacco Use   Smoking status: Never   Smokeless tobacco: Never  Substance and Sexual Activity   Alcohol use: Yes    Comment: 3-4 drinks a day   Drug use: Never   Sexual activity: Not on file  Other Topics Concern   Not on file  Social History Narrative   Not on file   Social Determinants of Health   Financial Resource Strain: Not on file  Food Insecurity: No Food Insecurity    Worried About Running Out of Food in the Last Year: Never true   Ran Out of Food in the Last Year: Never true  Transportation Needs: No Transportation Needs   Lack of Transportation (Medical): No   Lack of Transportation (Non-Medical): No  Physical Activity: Not on file  Stress: Not on file  Social Connections: Not on file     Family History: The patient's family history includes Aneurysm in his brother and father; Kidney disease in his father.  ROS:   Please see the history of present illness.     All other systems reviewed and are negative.   Risk Assessment/Calculations:           Physical Exam:    VS:  BP 122/80 (BP Location: Left Arm, Patient Position: Sitting, Cuff Size: Normal)   Pulse 70   Ht 5\' 8"  (1.727 m)   Wt 191 lb 12.8 oz (87 kg)   SpO2 98%   BMI 29.16 kg/m     Wt Readings from Last 3 Encounters:  05/24/21 191 lb 12.8 oz (87 kg)  04/05/21 194 lb (88 kg)  03/09/21 194 lb (88 kg)     GEN:  Well nourished, well developed in no acute distress HEENT: Normal NECK: No JVD; No carotid bruits LYMPHATICS: No lymphadenopathy CARDIAC: RRR, no murmurs, rubs, gallops RESPIRATORY:  Clear to auscultation without rales, wheezing or rhonchi  ABDOMEN: Soft, non-tender, non-distended MUSCULOSKELETAL:  No edema; No deformity  SKIN: Warm and dry NEUROLOGIC:  Alert and oriented x 3 PSYCHIATRIC:  Normal affect    EKGs/Labs/Other Studies Reviewed:    The following studies were reviewed today:  Echocardiogram 12/24/2020 1. Left ventricular ejection fraction, by estimation, is 50 to 55%. The  left ventricle has low normal function. The left ventricle has no regional  wall motion abnormalities. The left ventricular internal cavity size was  mildly dilated. Left ventricular  diastolic parameters are consistent with Grade I diastolic dysfunction  (impaired relaxation).   2. Right ventricular systolic function is normal. The right ventricular  size is normal.   3. The  mitral valve is normal in structure. Mild mitral valve  regurgitation. No evidence of mitral stenosis.   4. The aortic valve has an indeterminant number of cusps. Aortic valve  regurgitation is not visualized. No aortic stenosis is present.   5. Aortic dilatation noted. There is borderline dilatation of the aortic  root, measuring 38 mm.   6. The inferior vena cava is normal in size with greater than 50%  respiratory variability, suggesting right atrial pressure of 3 mmHg.  Nuclear stress test 01/13/2021 Nuclear stress EF: 41%. The left ventricular ejection fraction is moderately decreased (30-44%). There was no ST segment deviation noted during stress. Findings consistent with prior myocardial infarction. This is an intermediate risk  study.   There is a small, fixed defect of mild severity present in the mid inferior and apical inferior location, suggestive of prior infarct. There is also mild-moderately reduced LV ejection fraction and severe enlargement of LV end diastolic volume. Reduction in LVEF may be out of proportion to the degree of perfusion defect. Findings suggest a mixed cardiomyopathic process.    This is an intermediate risk study.  Cardiac MRI 02/11/2021 EXAM: CARDIAC MRI   TECHNIQUE: The patient was scanned on a 1.5 Tesla GE magnet. A dedicated cardiac coil was used. Functional imaging was done using Fiesta sequences. 2,3, and 4 chamber views were done to assess for RWMA's. Modified Simpson's rule using a short axis stack was used to calculate an ejection fraction on a dedicated work [employer] officer, [employer]. The patient received 8 cc of Gadavist. After 10 minutes inversion recovery sequences were used to assess for infiltration and scar tissue.   FINDINGS: Limited images of the lung fields showed no gross abnormalities.   Images limited due to motion artifact.   The left ventricle is mildly dilated with normal wall thickness. Trabeculation appears  prominent towards the apex but difficult to quantify for noncompaction evaluation due to motion artifact. Moderate diffuse hypokinesis, LV EF 39%. Normal right ventricular size and systolic function, EF 45%. Normal left and right atrial sizes. the aortic valve is not visualized well enough to be certain about cusp number. There does not appear to be significant regurgitation or stenosis. Probably mild mitral regurgitation.   On delayed enhancement images, there was no myocardial late gadolinium enhancement (LGE).   Measurements:   LVEDV 281 mL LVSV 110 mL   LVEF 39%   RVEDV 213 mL RVSV 97 mL RVEF 45%   ECV 32% via T1 mapping.   IMPRESSION: 1.  Technically difficult study due to respiratory artifact.   2.   Mildly dilated left ventricle with diffuse hypokinesis, EF 39%.   3.  Normal RV size with EF 45%.   4. No myocardial LGE, so no definitive evidence for prior infarct, myocarditis, or infiltrative disease.   5.  Mildly elevated ECV percentage is nonspecific.   Suspect nonischemic cardiomyopathy.   Dalton Mclean     Electronically Signed   By: Marca Ancona M.D.   On: 02/14/2021 13:38    EKG:  EKG is not ordered today.    Recent Labs: 03/18/2021: ALT 19 04/20/2021: BUN 16; Creatinine, Ser 1.22; Potassium 3.9; Sodium 139  Recent Lipid Panel    Component Value Date/Time   CHOL 118 03/18/2021 1008   TRIG 69 03/18/2021 1008   HDL 47 03/18/2021 1008   CHOLHDL 2.5 03/18/2021 1008   LDLCALC 57 03/18/2021 1008    ASSESSMENT & PLAN    Combined systolic and diastolic CHF-no increased DOE or activity intolerance.  Tolerating 49-51 twice daily Entresto well.  Reports compliance with carvedilol, spironolactone.  Labs remained stable. Continue carvedilol, spironolactone, Farxiga Increase Entresto to 97 103 dosing Heart healthy low-sodium diet-salty 6 given Increase physical activity as tolerated Order BMP in 1 week Recommend repeat echocardiogram once  medications have been optimized for 1 month.  Essential hypertension-BP today 122/80.  Well-controlled at home. Continue Entresto, carvedilol, spironolactone Heart healthy low-sodium diet-salty 6 given Increase physical activity as tolerated  Hyperlipidemia-03/18/2021: Cholesterol, Total 118; HDL 47; LDL Chol Calc (NIH) 57; Triglycerides 69 Continue rosuvastatin Heart healthy low-sodium high-fiber diet Increase physical activity as tolerated  Disposition: Follow-up with Dr. Shari Prows, me, or pharmacy in 1 months.  Medication Adjustments/Labs and Tests Ordered: Current medicines are reviewed at length with the patient today.  Concerns regarding medicines are outlined above.  No orders of the defined types were placed in this encounter.  No orders of the defined types were placed in this encounter.   There are no Patient Instructions on file for this visit.   Signed, Ronney Asters, NP  05/24/2021 10:08 AM      Notice: This dictation was prepared with Dragon dictation along with smaller phrase technology. Any transcriptional errors that result from this process are unintentional and may not be corrected upon review.  I spent 13 minutes examining this patient, reviewing medications, and using patient centered shared decision making involving her cardiac care.  Prior to her visit I spent greater than 20 minutes reviewing her past medical history,  medications, and prior cardiac tests.

## 2021-05-24 ENCOUNTER — Ambulatory Visit (HOSPITAL_BASED_OUTPATIENT_CLINIC_OR_DEPARTMENT_OTHER): Payer: Medicare Other | Admitting: General Practice

## 2021-05-24 ENCOUNTER — Telehealth: Payer: Self-pay | Admitting: Pharmacist

## 2021-05-24 ENCOUNTER — Encounter (HOSPITAL_BASED_OUTPATIENT_CLINIC_OR_DEPARTMENT_OTHER): Payer: Self-pay | Admitting: General Practice

## 2021-05-24 ENCOUNTER — Other Ambulatory Visit: Payer: Self-pay

## 2021-05-24 VITALS — BP 122/80 | HR 70 | Ht 68.0 in | Wt 191.8 lb

## 2021-05-24 DIAGNOSIS — I5022 Chronic systolic (congestive) heart failure: Secondary | ICD-10-CM | POA: Diagnosis not present

## 2021-05-24 DIAGNOSIS — E782 Mixed hyperlipidemia: Secondary | ICD-10-CM

## 2021-05-24 DIAGNOSIS — I10 Essential (primary) hypertension: Secondary | ICD-10-CM | POA: Diagnosis not present

## 2021-05-24 MED ORDER — SACUBITRIL-VALSARTAN 97-103 MG PO TABS: 1.0000 | ORAL_TABLET | Freq: Two times a day (BID) | ORAL | 3 refills | Status: DC

## 2021-05-24 MED ORDER — SACUBITRIL-VALSARTAN 97-103 MG PO TABS: 1.0000 | ORAL_TABLET | Freq: Two times a day (BID) | ORAL | 5 refills | Status: DC

## 2021-05-24 NOTE — Patient Instructions (Signed)
Medication Instructions:   CHANGE Entresto to 97-103 mg twice daily. You can take two of the 49-51 mg tablets until those run out. Then, start the 97-103 mg tablets 1 tablet twice daily.  *If you need a refill on your cardiac medications before your next appointment, please call your pharmacy*   Lab Work: Your physician recommends that you return for lab work in 1 week: BMET  If you have labs (blood work) drawn today and your tests are completely normal, you will receive your results only by: MyChart Message (if you have MyChart) OR A paper copy in the mail If you have any lab test that is abnormal or we need to change your treatment, we will call you to review the results.  Follow-Up: At [facility], you and your health needs are our priority.  As part of our continuing mission to provide you with exceptional heart care, we have created designated Provider Care Teams.  These Care Teams include your primary Cardiologist (physician) and Advanced Practice Providers (APPs -  Physician Assistants and Nurse Practitioners) who all work together to provide you with the care you need, when you need it.  We recommend signing up for the patient portal called "MyChart".  Sign up information is provided on this After Visit Summary.  MyChart is used to connect with patients for Virtual Visits (Telemedicine).  Patients are able to view lab/test results, encounter notes, upcoming appointments, etc.  Non-urgent messages can be sent to your provider as well.   To learn more about what you can do with MyChart, go to [email or web address].    Your next appointment:   1 month(s)  The format for your next appointment:   In Person  Provider:   Edd Fabian, FNP or PharmD for management of Entresto   Other Instructions  Edd Fabian, FNP has recommended continuing your physical activity and keeping a blood pressure log.  Tips to Measure your Blood Pressure Correctly  To determine whether you  have hypertension, a medical professional will take a blood pressure reading. How you prepare for the test, the position of your arm, and other factors can change a blood pressure reading by 10% or more. That could be enough to hide high blood pressure, start you on a drug you don't really need, or lead your doctor to incorrectly adjust your medications.  National and international guidelines offer specific instructions for measuring blood pressure. If a doctor, nurse, or medical assistant isn't doing it right, don't hesitate to ask him or her to get with the guidelines.  Here's what you can do to ensure a correct reading:  Don't drink a caffeinated beverage or smoke during the 30 minutes before the test.  Sit quietly for five minutes before the test begins.  During the measurement, sit in a chair with your feet on the floor and your arm supported so your elbow is at about heart level.  The inflatable part of the cuff should completely cover at least 80% of your upper arm, and the cuff should be placed on bare skin, not over a shirt.  Don't talk during the measurement.  Have your blood pressure measured twice, with a brief break in between. If the readings are different by 5 points or more, have it done a third time.  In 2017, new guidelines from the American Heart Association, the [employer] of Cardiology, and nine other health organizations lowered the diagnosis of high blood pressure to 130/80 mm Hg or higher for all adults.  The guidelines also redefined the various blood pressure categories to now include normal, elevated, Stage 1 hypertension, Stage 2 hypertension, and hypertensive crisis (see "Blood pressure categories").  Blood pressure categories  Blood pressure category SYSTOLIC (upper number)  DIASTOLIC (lower number)  Normal Less than 120 mm Hg and Less than 80 mm Hg  Elevated 120-129 mm Hg and Less than 80 mm Hg  High blood pressure: Stage 1 hypertension 130-139 mm Hg or 80-89  mm Hg  High blood pressure: Stage 2 hypertension 140 mm Hg or higher or 90 mm Hg or higher  Hypertensive crisis (consult your doctor immediately) Higher than 180 mm Hg and/or Higher than 120 mm Hg  Source: American Heart Association and American Stroke Association. For more on getting your blood pressure under control, buy Controlling Your Blood Pressure, a Special Health Report from [facility].   Blood Pressure Log   Date   Time  Blood Pressure  Position  Example: Nov 1 9 AM 124/78 sitting

## 2021-05-24 NOTE — Telephone Encounter (Signed)
Entresto dose was increased at visit today but rx was sent to [facility]. Pt is receiving Entresto from pt assistance with [employer], he does not fill at [facility]. I have sent rx into [facility] who dispenses rx for patients receiving assistance.

## 2021-05-25 NOTE — Telephone Encounter (Signed)
Pt c/o medication issue:  1. Name of Medication:  sacubitril-valsartan (ENTRESTO) 97-103 MG  2. How are you currently taking this medication (dosage and times per day)?  As currently prescribed   3. Are you having a reaction (difficulty breathing--STAT)?  No   4. What is your medication issue?   Patient is following up--he is requesting to remain on his current dose for Entresto. He states the increase will cost him $400/90 day supply. He also states his vitals are within normal parameters and BP is currently 110/71. He assumes increasing the medication is not necessary, but he would like a call back to discuss further.

## 2021-05-25 NOTE — Telephone Encounter (Signed)
Called and made the patient aware of the information below. The patient will start the higher dose of Entresto.

## 2021-05-25 NOTE — Telephone Encounter (Addendum)
Please advise pt of below message I documented. His Entresto was sent in to the incorrect pharmacy at his visit yesterday with APP. I already corrected the issue and sent rx into [facility] who dispenses Entresto for patients who are in the patient assistance program. He should continue to receive med for free from them in the mail.

## 2021-05-31 ENCOUNTER — Other Ambulatory Visit: Payer: Self-pay

## 2021-05-31 ENCOUNTER — Other Ambulatory Visit: Payer: Medicare Other | Admitting: *Deleted

## 2021-05-31 DIAGNOSIS — I5022 Chronic systolic (congestive) heart failure: Secondary | ICD-10-CM | POA: Diagnosis not present

## 2021-05-31 DIAGNOSIS — E782 Mixed hyperlipidemia: Secondary | ICD-10-CM | POA: Diagnosis not present

## 2021-05-31 DIAGNOSIS — I10 Essential (primary) hypertension: Secondary | ICD-10-CM | POA: Diagnosis not present

## 2021-05-31 LAB — BASIC METABOLIC PANEL
BUN/Creatinine Ratio: 18 (ref 10–24)
BUN: 23 mg/dL (ref 8–27)
CO2: 18 mmol/L — ABNORMAL LOW (ref 20–29)
Calcium: 9.1 mg/dL (ref 8.6–10.2)
Chloride: 106 mmol/L (ref 96–106)
Creatinine, Ser: 1.26 mg/dL (ref 0.76–1.27)
Glucose: 100 mg/dL — ABNORMAL HIGH (ref 65–99)
Potassium: 4.2 mmol/L (ref 3.5–5.2)
Sodium: 139 mmol/L (ref 134–144)
eGFR: 63 mL/min/{1.73_m2} (ref >59)

## 2021-06-01 DIAGNOSIS — E039 Hypothyroidism, unspecified: Secondary | ICD-10-CM | POA: Diagnosis not present

## 2021-06-01 DIAGNOSIS — N1831 Chronic kidney disease, stage 3a: Secondary | ICD-10-CM | POA: Diagnosis not present

## 2021-06-01 DIAGNOSIS — E7800 Pure hypercholesterolemia, unspecified: Secondary | ICD-10-CM | POA: Diagnosis not present

## 2021-06-01 DIAGNOSIS — I129 Hypertensive chronic kidney disease with stage 1 through stage 4 chronic kidney disease, or unspecified chronic kidney disease: Secondary | ICD-10-CM | POA: Diagnosis not present

## 2021-06-10 DIAGNOSIS — Z23 Encounter for immunization: Secondary | ICD-10-CM | POA: Diagnosis not present

## 2021-06-20 ENCOUNTER — Telehealth: Payer: Self-pay | Admitting: Cardiology

## 2021-06-20 ENCOUNTER — Other Ambulatory Visit: Payer: Self-pay | Admitting: *Deleted

## 2021-06-20 MED ORDER — SACUBITRIL-VALSARTAN 97-103 MG PO TABS: 1.0000 | ORAL_TABLET | Freq: Two times a day (BID) | ORAL | 2 refills | Status: DC

## 2021-06-20 MED ORDER — SACUBITRIL-VALSARTAN 97-103 MG PO TABS: 1.0000 | ORAL_TABLET | Freq: Two times a day (BID) | ORAL | 5 refills | Status: DC

## 2021-06-20 NOTE — Telephone Encounter (Signed)
*  STAT* If patient is at the pharmacy, call can be transferred to refill team.   1. Which medications need to be refilled? (please list name of each medication and dose if known)  sacubitril-valsartan (ENTRESTO) 97-103 MG  2. Which pharmacy/location (including street and city if local pharmacy) is medication to be sent to? [facility] [employer] - [place], [place] - [address]  3. Do they need a 30 day or 90 day supply?  90 day supply  Patient states he is completely out of medication.

## 2021-06-28 NOTE — Progress Notes (Signed)
Cardiology Office Note:    Date:  07/01/2021   ID:  Jeffery [name], DOB August 23, 1956, MRN [id number]  PCP:  Jeffery Brunette, MD   [facility] Providers Cardiologist:  Meriam Sprague, MD      Referring MD: Jeffery Brunette, MD   Follow-up for chronic systolic CHF and essential hypertension  History of Present Illness:    Jeffery [name] is a 65 y.o. male with a hx of hypertension, hyperlipidemia, nonischemic cardiomyopathy with LVEF 39% on cardiac MRI 5/22, PVCs, and coronary artery disease identified on chest CT 2/22-calcium score 198.  Echocardiogram 12/24/2020 LVEF 50-55%, G1 DD, and mild mitral valve regurgitation.   His losartan-HCTZ and amlodipine were stopped by Dr. Shari Prows on 02/17/2021.  He was placed on Entresto and carvedilol due to his new diagnosis of CHF.  His BMP 1 week later was stable.  He was seen by clinical pharmacist on 03/09/2021 and again on 04/05/2021.  He had been approved for [facility] patient assistance but was denied for [employer] assistance.  He noted to have significant improvement in his LDL cholesterol from 171 down to 57 on rosuvastatin 20 mg daily.   He was seen in the clinic on 04/05/2021 and reported that he was compliant with his carvedilol.  He was only taking 24-26 twice daily of his Entresto.  He had not started her 49-51 dosing yet but had received the medication.  He was tolerating his medications well.  He denied dizziness, headaches, lower extremity swelling, and shortness of breath.  He reported that he had some dietary indiscretion.  His home [name] pressure was 130/80.   He presented to the clinic 05/24/21 for follow-up evaluation and stated he feels well.  He continued to be very physically active.  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  He was also doing yard work with a Firefighter, and was the primary caregiver for his wife who is handicapped.  He had been monitoring his [name] pressure and heart  rate.  He continued to monitor his salt intake.  We reviewed his Entresto medication which I increased to 97 103 dosing.  I ordered a BMP in 1 week and planned follow-up in 1 month.  His BMP 05/31/2021 remained stable.  He presents the clinic today for follow-up evaluation states he feels well.  He continues to be very physically active working, walking, and cutting grass.  He reports that for the first 2 days after initiating higher dose of Entresto he felt slight dizziness.  He reports that it is now resolved.  He brings in a [name] pressure log which reports/shows well-controlled [name] pressure  in the 110s-120s systolic.  We will repeat his echocardiogram, continue his low-salt diet, continue his increased physical activity, and plan follow-up in 6 months.   Today he denies chest pain, shortness of breath, lower extremity edema, fatigue, palpitations, melena, hematuria, hemoptysis, diaphoresis, weakness, presyncope, syncope, orthopnea, and PND.  Past Medical History:  Diagnosis Date   Cough due to ACE inhibitor    Hypertension    Internal hemorrhoid     Past Surgical History:  Procedure Laterality Date   BIOPSY PROSTATE      Current Medications: Current Meds  Medication Sig   aspirin EC 81 MG tablet Take 1 tablet (81 mg total) by mouth daily. Swallow whole.   carvedilol (COREG) 12.5 MG tablet Take 1 tablet (12.5 mg total) by mouth 2 (two) times daily.   dapagliflozin  propanediol (FARXIGA) 10 MG TABS tablet Take 10 mg by mouth daily.   Multiple Vitamin (MULTIVITAMIN) tablet Take 1 tablet by mouth daily.   rosuvastatin (CRESTOR) 20 MG tablet Take 20 mg by mouth at bedtime.   sacubitril-valsartan (ENTRESTO) 97-103 MG Take 1 tablet by mouth 2 (two) times daily.   spironolactone (ALDACTONE) 25 MG tablet Take 1 tablet (25 mg total) by mouth daily.     Allergies:   Patient has no known allergies.   Social History   Socioeconomic History   Marital status: Single    Spouse name: Not on  file   Number of children: Not on file   Years of education: Not on file   Highest education level: Not on file  Occupational History   Not on file  Tobacco Use   Smoking status: Never   Smokeless tobacco: Never  Substance and Sexual Activity   Alcohol use: Yes    Comment: 3-4 drinks a day   Drug use: Never   Sexual activity: Not on file  Other Topics Concern   Not on file  Social History Narrative   Not on file   Social Determinants of Health   Financial Resource Strain: Not on file  Food Insecurity: No Food Insecurity   Worried About Running Out of Food in the Last Year: Never true   Ran Out of Food in the Last Year: Never true  Transportation Needs: No Transportation Needs   Lack of Transportation (Medical): No   Lack of Transportation (Non-Medical): No  Physical Activity: Not on file  Stress: Not on file  Social Connections: Not on file     Family History: The patient's family history includes Aneurysm in his brother and father; Kidney disease in his father.  ROS:   Please see the history of present illness.     All other systems reviewed and are negative.   Risk Assessment/Calculations:           Physical Exam:    VS:  BP 120/62 (BP Location: Left Arm, Patient Position: Sitting, Cuff Size: Normal)   Pulse 66   Ht 5\' 8"  (1.727 m)   Wt 192 lb 1.6 oz (87.1 kg)   SpO2 99%   BMI 29.21 kg/m     Wt Readings from Last 3 Encounters:  07/01/21 192 lb 1.6 oz (87.1 kg)  05/24/21 191 lb 12.8 oz (87 kg)  04/05/21 194 lb (88 kg)     GEN:  Well nourished, well developed in no acute distress HEENT: Normal NECK: No JVD; No carotid bruits LYMPHATICS: No lymphadenopathy CARDIAC: RRR, no murmurs, rubs, gallops RESPIRATORY:  Clear to auscultation without rales, wheezing or rhonchi  ABDOMEN: Soft, non-tender, non-distended MUSCULOSKELETAL:  No edema; No deformity  SKIN: Warm and dry NEUROLOGIC:  Alert and oriented x 3 PSYCHIATRIC:  Normal affect     EKGs/Labs/Other Studies Reviewed:    The following studies were reviewed today: Echocardiogram 12/24/2020 1. Left ventricular ejection fraction, by estimation, is 50 to 55%. The  left ventricle has low normal function. The left ventricle has no regional  wall motion abnormalities. The left ventricular internal cavity size was  mildly dilated. Left ventricular  diastolic parameters are consistent with Grade I diastolic dysfunction  (impaired relaxation).   2. Right ventricular systolic function is normal. The right ventricular  size is normal.   3. The mitral valve is normal in structure. Mild mitral valve  regurgitation. No evidence of mitral stenosis.   4. The aortic valve has  an indeterminant number of cusps. Aortic valve  regurgitation is not visualized. No aortic stenosis is present.   5. Aortic dilatation noted. There is borderline dilatation of the aortic  root, measuring 38 mm.   6. The inferior vena cava is normal in size with greater than 50%  respiratory variability, suggesting right atrial pressure of 3 mmHg.   Nuclear stress test 01/13/2021 Nuclear stress EF: 41%. The left ventricular ejection fraction is moderately decreased (30-44%). There was no ST segment deviation noted during stress. Findings consistent with prior myocardial infarction. This is an intermediate risk study.   There is a small, fixed defect of mild severity present in the mid inferior and apical inferior location, suggestive of prior infarct. There is also mild-moderately reduced LV ejection fraction and severe enlargement of LV end diastolic volume. Reduction in LVEF may be out of proportion to the degree of perfusion defect. Findings suggest a mixed cardiomyopathic process.    This is an intermediate risk study.   Cardiac MRI 02/11/2021 EXAM: CARDIAC MRI   TECHNIQUE: The patient was scanned on a 1.5 Tesla GE magnet. A dedicated cardiac coil was used. Functional imaging was done using  Fiesta sequences. 2,3, and 4 chamber views were done to assess for RWMA's. Modified Simpson's rule using a short axis stack was used to calculate an ejection fraction on a dedicated work [employer], [employer]. The patient received 8 cc of Gadavist. After 10 minutes inversion recovery sequences were used to assess for infiltration and scar tissue.   FINDINGS: Limited images of the lung fields showed no gross abnormalities.   Images limited due to motion artifact.   The left ventricle is mildly dilated with normal wall thickness. Trabeculation appears prominent towards the apex but difficult to quantify for noncompaction evaluation due to motion artifact. Moderate diffuse hypokinesis, LV EF 39%. Normal right ventricular size and systolic function, EF 45%. Normal left and right atrial sizes. the aortic valve is not visualized well enough to be certain about cusp number. There does not appear to be significant regurgitation or stenosis. Probably mild mitral regurgitation.   On delayed enhancement images, there was no myocardial late gadolinium enhancement (LGE).   Measurements:   LVEDV 281 mL LVSV 110 mL   LVEF 39%   RVEDV 213 mL RVSV 97 mL RVEF 45%   ECV 32% via T1 mapping.   IMPRESSION: 1.  Technically difficult study due to respiratory artifact.   2.   Mildly dilated left ventricle with diffuse hypokinesis, EF 39%.   3.  Normal RV size with EF 45%.   4. No myocardial LGE, so no definitive evidence for prior infarct, myocarditis, or infiltrative disease.   5.  Mildly elevated ECV percentage is nonspecific.   Suspect nonischemic cardiomyopathy.   Dalton Mclean     Electronically Signed   By: Marca Ancona M.D.   On: 02/14/2021 13:38    EKG: None today.  Recent Labs: 03/18/2021: ALT 19 05/31/2021: BUN 23; Creatinine, Ser 1.26; Potassium 4.2; Sodium 139  Recent Lipid Panel    Component Value Date/Time   CHOL 118 03/18/2021 1008   TRIG 69  03/18/2021 1008   HDL 47 03/18/2021 1008   CHOLHDL 2.5 03/18/2021 1008   LDLCALC 57 03/18/2021 1008    ASSESSMENT & PLAN    Combined systolic and diastolic CHF/NICM-tolerating up titration well.  Continues to increase physical activity.  BMP remained stable. Continue carvedilol, spironolactone, Farxiga Continue Entresto to 97 103 dosing Heart healthy low-sodium diet-salty 6  given Increase physical activity as tolerated Repeat echocardiogram   Essential hypertension-BP today 120/62.  Well-controlled at home. Continue Entresto, carvedilol, spironolactone Heart healthy low-sodium diet-salty 6 given Increase physical activity as tolerated   Hyperlipidemia-03/18/2021: Cholesterol, Total 118; HDL 47; LDL Chol Calc (NIH) 57; Triglycerides 69 Continue rosuvastatin Heart healthy low-sodium high-fiber diet Increase physical activity as tolerated   Disposition: Follow-up with Dr. Shari Prows or me in 6 months       Medication Adjustments/Labs and Tests Ordered: Current medicines are reviewed at length with the patient today.  Concerns regarding medicines are outlined above.  Orders Placed This Encounter  Procedures   ECHOCARDIOGRAM COMPLETE   No orders of the defined types were placed in this encounter.   There are no Patient Instructions on file for this visit.   Signed, Ronney Asters, NP  07/01/2021 10:11 AM      Notice: This dictation was prepared with Dragon dictation along with smaller phrase technology. Any transcriptional errors that result from this process are unintentional and may not be corrected upon review.  I spent 13 minutes examining this patient, reviewing medications, and using patient centered shared decision making involving her cardiac care.  Prior to her visit I spent greater than 20 minutes reviewing her past medical history,  medications, and prior cardiac tests.

## 2021-07-01 ENCOUNTER — Other Ambulatory Visit: Payer: Self-pay

## 2021-07-01 ENCOUNTER — Ambulatory Visit (HOSPITAL_BASED_OUTPATIENT_CLINIC_OR_DEPARTMENT_OTHER): Payer: Medicare Other | Admitting: General Practice

## 2021-07-01 ENCOUNTER — Encounter (HOSPITAL_BASED_OUTPATIENT_CLINIC_OR_DEPARTMENT_OTHER): Payer: Self-pay | Admitting: General Practice

## 2021-07-01 VITALS — BP 120/62 | HR 66 | Ht 68.0 in | Wt 192.1 lb

## 2021-07-01 DIAGNOSIS — I5042 Chronic combined systolic (congestive) and diastolic (congestive) heart failure: Secondary | ICD-10-CM | POA: Diagnosis not present

## 2021-07-01 DIAGNOSIS — N1831 Chronic kidney disease, stage 3a: Secondary | ICD-10-CM | POA: Diagnosis not present

## 2021-07-01 DIAGNOSIS — E782 Mixed hyperlipidemia: Secondary | ICD-10-CM

## 2021-07-01 DIAGNOSIS — I129 Hypertensive chronic kidney disease with stage 1 through stage 4 chronic kidney disease, or unspecified chronic kidney disease: Secondary | ICD-10-CM | POA: Diagnosis not present

## 2021-07-01 DIAGNOSIS — E7800 Pure hypercholesterolemia, unspecified: Secondary | ICD-10-CM | POA: Diagnosis not present

## 2021-07-01 DIAGNOSIS — I10 Essential (primary) hypertension: Secondary | ICD-10-CM | POA: Diagnosis not present

## 2021-07-01 DIAGNOSIS — I428 Other cardiomyopathies: Secondary | ICD-10-CM | POA: Diagnosis not present

## 2021-07-01 DIAGNOSIS — E039 Hypothyroidism, unspecified: Secondary | ICD-10-CM | POA: Diagnosis not present

## 2021-07-01 NOTE — Patient Instructions (Signed)
Medication Instructions:  Continue current medications  *If you need a refill on your cardiac medications before your next appointment, please call your pharmacy*   Lab Work: None Ordered   Testing/Procedures: Your physician has requested that you have an echocardiogram. Echocardiography is a painless test that uses sound waves to create images of your heart. It provides your doctor with information about the size and shape of your heart and how well your heart's chambers and valves are working. This procedure takes approximately one hour. There are no restrictions for this procedure.   Follow-Up: At [facility], you and your health needs are our priority.  As part of our continuing mission to provide you with exceptional heart care, we have created designated Provider Care Teams.  These Care Teams include your primary Cardiologist (physician) and Advanced Practice Providers (APPs -  Physician Assistants and Nurse Practitioners) who all work together to provide you with the care you need, when you need it.  We recommend signing up for the patient portal called "MyChart".  Sign up information is provided on this After Visit Summary.  MyChart is used to connect with patients for Virtual Visits (Telemedicine).  Patients are able to view lab/test results, encounter notes, upcoming appointments, etc.  Non-urgent messages can be sent to your provider as well.   To learn more about what you can do with MyChart, go to [email or web address].    Your next appointment:   6 month(s)  The format for your next appointment:   In Person  Provider:   You may see Meriam Sprague, MD or one of the following Advanced Practice Providers on your designated Care Team:   Tereso Newcomer, PA-C [place], [place]

## 2021-07-18 ENCOUNTER — Ambulatory Visit (HOSPITAL_COMMUNITY): Payer: Medicare Other | Attending: Internal Medicine

## 2021-07-18 ENCOUNTER — Other Ambulatory Visit: Payer: Self-pay

## 2021-07-18 DIAGNOSIS — I5042 Chronic combined systolic (congestive) and diastolic (congestive) heart failure: Secondary | ICD-10-CM | POA: Diagnosis not present

## 2021-07-18 LAB — ECHOCARDIOGRAM COMPLETE
Area-P 1/2: 4.15 cm2
S' Lateral: 4.25 cm

## 2021-08-01 DIAGNOSIS — E039 Hypothyroidism, unspecified: Secondary | ICD-10-CM | POA: Diagnosis not present

## 2021-08-01 DIAGNOSIS — E7800 Pure hypercholesterolemia, unspecified: Secondary | ICD-10-CM | POA: Diagnosis not present

## 2021-08-01 DIAGNOSIS — N1831 Chronic kidney disease, stage 3a: Secondary | ICD-10-CM | POA: Diagnosis not present

## 2021-08-01 DIAGNOSIS — I129 Hypertensive chronic kidney disease with stage 1 through stage 4 chronic kidney disease, or unspecified chronic kidney disease: Secondary | ICD-10-CM | POA: Diagnosis not present

## 2021-08-10 ENCOUNTER — Telehealth: Payer: Self-pay | Admitting: *Deleted

## 2021-08-10 NOTE — Telephone Encounter (Signed)
[employer] pt assistance foundation re-enrollment packet received in Dr. Devin Going mailbox for this pt.  Action required is NPAF-Re-enrollment packet to be completed, with PA, for enrollment form will expire on Oct 01 2021. Scanned paperwork/re-enrollment form to our Prior Auth Nurse's email information for completion of forms.  Will also place paperwork in PA box, incase this is needed. Will route this message to our PA Nurse to make her aware of forms scanned to her email.

## 2021-08-22 ENCOUNTER — Telehealth: Payer: Self-pay

## 2021-08-22 MED ORDER — SACUBITRIL-VALSARTAN 97-103 MG PO TABS: 1.0000 | ORAL_TABLET | Freq: Two times a day (BID) | ORAL | 3 refills | Status: AC

## 2021-08-22 NOTE — Telephone Encounter (Signed)
**Note De-Identified Jeffery [name] Obfuscation** The pt left her [employer] pt asst application at the office with documents.  I have completed the providers page of her application and have emailed all to Dr Devin Going nurse so she can print a Entresto prescription for #180 with 3 refills, have Dr Shari Prows sign both the prescription and the application and so she can fax all to [employer] pt asst foundation at the fax number written on the cover letter included or to place in the to be faxed basket in Medical records to be faxed.

## 2021-08-22 NOTE — Telephone Encounter (Signed)
Application forms printed and signed off by Dr. Shari Prows.  Entresto script also printed and signed off by Dr. Shari Prows.  All forms completed and placed in nurse fax box in medical records, to be faxed to contact information provided on cover sheet.  Will make Prior Auth Nurse aware of completion.

## 2021-08-31 DIAGNOSIS — E039 Hypothyroidism, unspecified: Secondary | ICD-10-CM | POA: Diagnosis not present

## 2021-08-31 DIAGNOSIS — E7800 Pure hypercholesterolemia, unspecified: Secondary | ICD-10-CM | POA: Diagnosis not present

## 2021-08-31 DIAGNOSIS — M1831 Unilateral post-traumatic osteoarthritis of first carpometacarpal joint, right hand: Secondary | ICD-10-CM | POA: Diagnosis not present

## 2021-08-31 DIAGNOSIS — I129 Hypertensive chronic kidney disease with stage 1 through stage 4 chronic kidney disease, or unspecified chronic kidney disease: Secondary | ICD-10-CM | POA: Diagnosis not present

## 2021-09-30 DIAGNOSIS — I129 Hypertensive chronic kidney disease with stage 1 through stage 4 chronic kidney disease, or unspecified chronic kidney disease: Secondary | ICD-10-CM | POA: Diagnosis not present

## 2021-09-30 DIAGNOSIS — E039 Hypothyroidism, unspecified: Secondary | ICD-10-CM | POA: Diagnosis not present

## 2021-09-30 DIAGNOSIS — N1831 Chronic kidney disease, stage 3a: Secondary | ICD-10-CM | POA: Diagnosis not present

## 2021-09-30 DIAGNOSIS — E7800 Pure hypercholesterolemia, unspecified: Secondary | ICD-10-CM | POA: Diagnosis not present

## 2021-10-11 NOTE — Telephone Encounter (Signed)
**Note De-Identified Vonetta Foulk Obfuscation** Letter received Zakari Bathe fax from [employer] Pt [employer] stating that they have approved the pt for asst with Entresto until 10/01/2022. Pt ID: [id number]  The letter states that they have notified the pt of this approval as well.

## 2021-10-31 DIAGNOSIS — E782 Mixed hyperlipidemia: Secondary | ICD-10-CM | POA: Diagnosis not present

## 2021-10-31 DIAGNOSIS — R7309 Other abnormal glucose: Secondary | ICD-10-CM | POA: Diagnosis not present

## 2021-10-31 DIAGNOSIS — I10 Essential (primary) hypertension: Secondary | ICD-10-CM | POA: Diagnosis not present

## 2021-11-01 DIAGNOSIS — I129 Hypertensive chronic kidney disease with stage 1 through stage 4 chronic kidney disease, or unspecified chronic kidney disease: Secondary | ICD-10-CM | POA: Diagnosis not present

## 2021-11-01 DIAGNOSIS — N1831 Chronic kidney disease, stage 3a: Secondary | ICD-10-CM | POA: Diagnosis not present

## 2021-11-01 DIAGNOSIS — E7800 Pure hypercholesterolemia, unspecified: Secondary | ICD-10-CM | POA: Diagnosis not present

## 2021-11-01 DIAGNOSIS — E039 Hypothyroidism, unspecified: Secondary | ICD-10-CM | POA: Diagnosis not present

## 2021-11-03 DIAGNOSIS — Z0000 Encounter for general adult medical examination without abnormal findings: Secondary | ICD-10-CM | POA: Diagnosis not present

## 2021-11-03 DIAGNOSIS — I77810 Thoracic aortic ectasia: Secondary | ICD-10-CM | POA: Diagnosis not present

## 2021-11-03 DIAGNOSIS — I2510 Atherosclerotic heart disease of native coronary artery without angina pectoris: Secondary | ICD-10-CM | POA: Diagnosis not present

## 2021-11-03 DIAGNOSIS — R7303 Prediabetes: Secondary | ICD-10-CM | POA: Diagnosis not present

## 2021-11-03 DIAGNOSIS — I5042 Chronic combined systolic (congestive) and diastolic (congestive) heart failure: Secondary | ICD-10-CM | POA: Diagnosis not present

## 2021-11-03 DIAGNOSIS — I10 Essential (primary) hypertension: Secondary | ICD-10-CM | POA: Diagnosis not present

## 2021-11-16 ENCOUNTER — Telehealth: Payer: Self-pay | Admitting: *Deleted

## 2021-11-16 DIAGNOSIS — I10 Essential (primary) hypertension: Secondary | ICD-10-CM | POA: Diagnosis not present

## 2021-11-16 DIAGNOSIS — I5020 Unspecified systolic (congestive) heart failure: Secondary | ICD-10-CM | POA: Diagnosis not present

## 2021-11-16 DIAGNOSIS — Z86010 Personal history of colonic polyps: Secondary | ICD-10-CM | POA: Diagnosis not present

## 2021-11-16 NOTE — Telephone Encounter (Signed)
° °  Pre-operative Risk Assessment    Patient Name: Jeffery [name]  DOB: April 25, 1956 MRN: [id number]      Request for Surgical Clearance    Procedure:   COLONOSCOPY  Date of Surgery:  Clearance 12/13/21                                 Surgeon:  DR. HUNG Surgeon's Group or Practice Name:  [facility] Phone number:  [phone] Fax number:  [phone]   Type of Clearance Requested:   - Medical ; NO MEDICATIONS LISTED TO BE HELD   Type of Anesthesia:   PROPOFOL   Additional requests/questions:    Elpidio Anis   11/16/2021, 2:29 PM

## 2021-11-16 NOTE — Telephone Encounter (Signed)
° °  Name: Jeffery [name]  DOB: May 31, 1956  MRN: [id number]   Primary Cardiologist: Meriam Sprague, MD  Chart reviewed as part of pre-operative protocol coverage. Patient was contacted 11/16/2021 in reference to pre-operative risk assessment for pending surgery as outlined below.  [facility] was last seen 06/2021 by Edd Fabian, NP. Last echo 07/2021 showed improved LVEF compared to prior at 53%, mild MR, also with hx of NICM, PVCs, cor calcification. I reached out to patient for update on how he is doing. The patient affirms he has been doing well without any new cardiac symptoms. Therefore, based on ACC/AHA guidelines, the patient would be at acceptable risk for the planned procedure without further cardiovascular testing. The patient was advised that if he develops new symptoms prior to surgery to contact our office to arrange for a follow-up visit, and he verbalized understanding.  Will route this bundled recommendation to requesting provider via Epic fax function. Please call with questions.   Laurann Montana, PA-C 11/16/2021, 5:23 PM

## 2021-11-20 IMAGING — MR MR CARD MORPHOLOGY WO/W CM
45 of 48 series · 45 of 48 positions shown · IV contrast (Contrast agent)
Comparison: none

CLINICAL DATA: Abnormal Cardiolite

EXAM:
CARDIAC MRI
TECHNIQUE: The patient was scanned on a 1.5 Tesla GE magnet. A dedicated
cardiac coil was used. Functional imaging was done using Fiesta
sequences. [DATE], and 4 chamber views were done to assess for RWMA's.
Modified Hucyca rule using a short axis stack was used to
calculate an ejection fraction on a dedicated work station using
[employer] software. The patient received 8 cc of Gadavist. After 10
minutes inversion recovery sequences were used to assess for
infiltration and scar tissue.

[Series 4: [id number]_bh · axial · 8.0mm · 1.41mm/px · 1 of 17 slices shown]
[im 1/17]
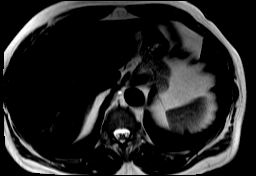

[Series 8: bSSFP · oblique · 8.0mm · 1.61mm/px · 1 of 25 slices shown (1 of 22)]
[im 1/25]
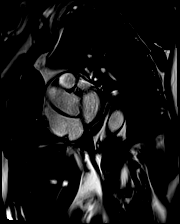

[Series 9: bSSFP · oblique · 8.0mm · 1.61mm/px · 1 of 25 slices shown (2 of 22)]
[im 1/25]
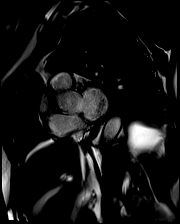

[Series 10: bSSFP · oblique · 8.0mm · 1.61mm/px · 1 of 25 slices shown (3 of 22)]
[im 1/25]
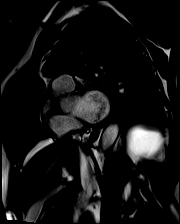

[Series 11: bSSFP · oblique · 8.0mm · 1.61mm/px · 1 of 25 slices shown (4 of 22)]
[im 1/25]
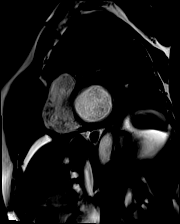

[Series 12: bSSFP · oblique · 8.0mm · 1.61mm/px · 1 of 25 slices shown (5 of 22)]
[im 1/25]
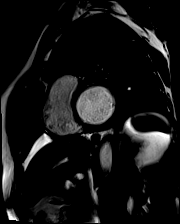

[Series 13: bSSFP · oblique · 8.0mm · 1.61mm/px · 1 of 25 slices shown (6 of 22)]
[im 1/25]
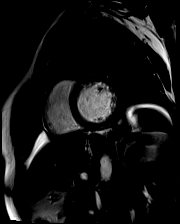

[Series 14: bSSFP · oblique · 8.0mm · 1.61mm/px · 1 of 25 slices shown (7 of 22)]
[im 1/25]
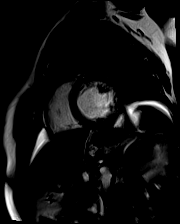

[Series 15: bSSFP · oblique · 8.0mm · 1.61mm/px · 1 of 25 slices shown (8 of 22)]
[im 1/25]
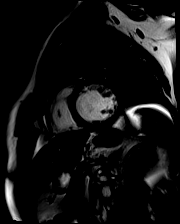

[Series 16: bSSFP · oblique · 8.0mm · 1.61mm/px · 1 of 25 slices shown (9 of 22)]
[im 1/25]
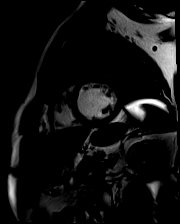

[Series 17: bSSFP · oblique · 8.0mm · 1.61mm/px · 1 of 25 slices shown (10 of 22)]
[im 1/25]
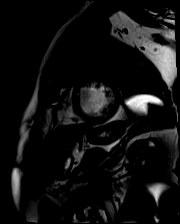

[Series 18: bSSFP · oblique · 8.0mm · 1.61mm/px · 1 of 25 slices shown (11 of 22)]
[im 1/25]
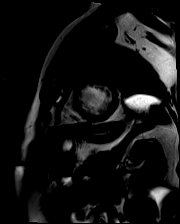

[Series 19: bSSFP · oblique · 8.0mm · 1.61mm/px · 1 of 25 slices shown (12 of 22)]
[im 1/25]
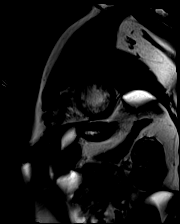

[Series 20: bSSFP · oblique · 8.0mm · 1.61mm/px · 1 of 25 slices shown (13 of 22)]
[im 1/25]
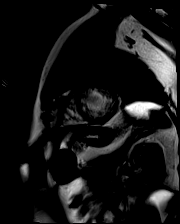

[Series 21: bSSFP · oblique · 8.0mm · 1.61mm/px · 1 of 25 slices shown (14 of 22)]
[im 1/25]
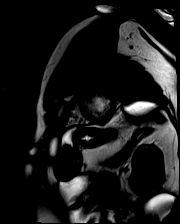

[Series 22: bSSFP · oblique · 8.0mm · 1.61mm/px · 1 of 25 slices shown (15 of 22)]
[im 1/25]
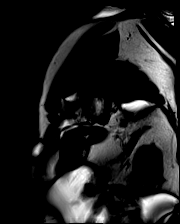

[Series 23: bSSFP · oblique · 8.0mm · 1.61mm/px · 1 of 25 slices shown (16 of 22)]
[im 1/25]
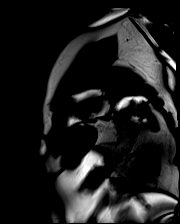

[Series 24: bSSFP · oblique · 8.0mm · 1.61mm/px · 1 of 25 slices shown (17 of 22)]
[im 1/25]
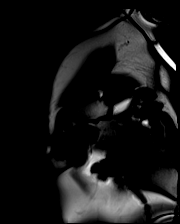

[Series 25: bSSFP · oblique · 8.0mm · 1.61mm/px · 1 of 25 slices shown (18 of 22)]
[im 1/25]
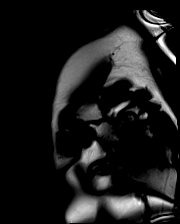

[Series 26: bSSFP · oblique · 8.0mm · 1.61mm/px · 1 of 25 slices shown (19 of 22)]
[im 1/25]
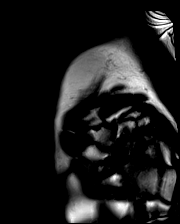

[Series 27: bSSFP · oblique · 6.0mm · 1.41mm/px · 1 of 25 slices shown (20 of 22)]
[im 1/25]
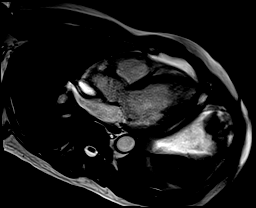

[Series 28: bSSFP · oblique · 6.0mm · 1.41mm/px · 1 of 25 slices shown (21 of 22)]
[im 1/25]
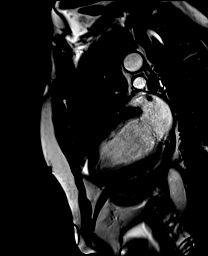

[Series 29: bSSFP · axial · 6.0mm · 1.41mm/px · 1 of 25 slices shown (22 of 22)]
[im 1/25]
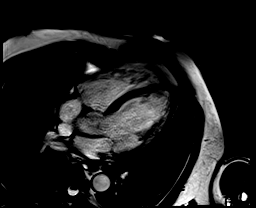

[Series 30: STIR · oblique · 8.0mm · 1.92mm/px · 1 of 15 slices shown]
[im 1/15]
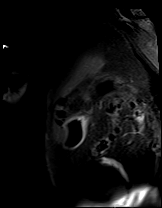

[Series 31: (id)_long_t1 · oblique · 8.0mm · 1.56mm/px · 1 of 24 slices shown]
[im 1/24]
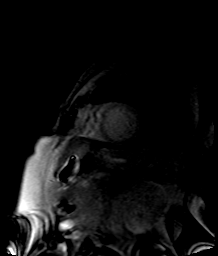

[Series 32: (id)_long_t1_moco · oblique · 8.0mm · 1.56mm/px · 1 of 24 slices shown]
[im 1/24]
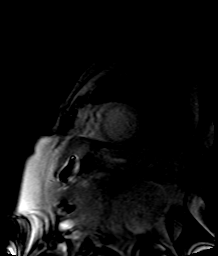

[Series 35: (id)_trufi · oblique · 8.0mm · 2.08mm/px · 1 of 9 slices shown]
[im 1/9]
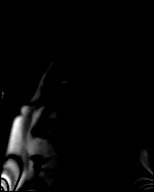

[Series 36: (id)_trufi_moco · oblique · 8.0mm · 2.08mm/px · 1 of 9 slices shown]
[im 1/9]
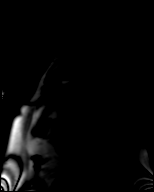

[Series 39: cine_trufi_cs_rt_short axis · oblique · 8.0mm · 1.73mm/px · 1 of 49 slices shown (1 of 17)]
[im 1/49]
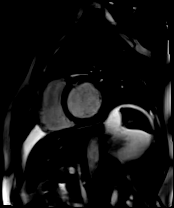

[Series 39: cine_trufi_cs_rt_short axis · oblique · 8.0mm · 1.73mm/px · 1 of 49 slices shown (2 of 17)]
[im 1/49]
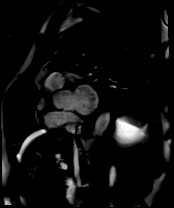

[Series 39: cine_trufi_cs_rt_short axis · oblique · 8.0mm · 1.73mm/px · 1 of 49 slices shown (3 of 17)]
[im 1/49]
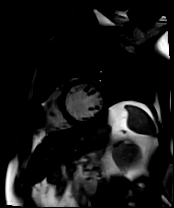

[Series 39: cine_trufi_cs_rt_short axis · oblique · 8.0mm · 1.73mm/px · 1 of 49 slices shown (4 of 17)]
[im 1/49]
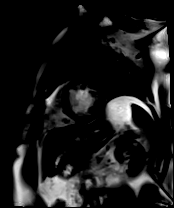

[Series 39: cine_trufi_cs_rt_short axis · oblique · 8.0mm · 1.73mm/px · 1 of 49 slices shown (5 of 17)]
[im 1/49]
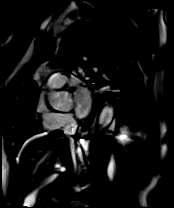

[Series 39: cine_trufi_cs_rt_short axis · oblique · 8.0mm · 1.73mm/px · 1 of 49 slices shown (6 of 17)]
[im 1/49]
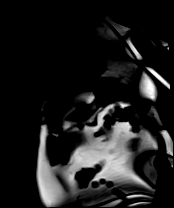

[Series 39: cine_trufi_cs_rt_short axis · oblique · 8.0mm · 1.73mm/px · 1 of 49 slices shown (7 of 17)]
[im 1/49]
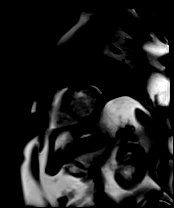

[Series 39: cine_trufi_cs_rt_short axis · oblique · 8.0mm · 1.73mm/px · 1 of 49 slices shown (8 of 17)]
[im 1/49]
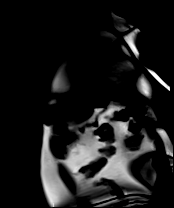

[Series 39: cine_trufi_cs_rt_short axis · oblique · 8.0mm · 1.73mm/px · 1 of 49 slices shown (9 of 17)]
[im 1/49]
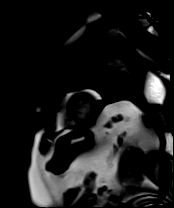

[Series 39: cine_trufi_cs_rt_short axis · oblique · 8.0mm · 1.73mm/px · 1 of 49 slices shown (10 of 17)]
[im 1/49]
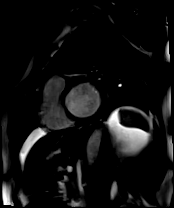

[Series 39: cine_trufi_cs_rt_short axis · oblique · 8.0mm · 1.73mm/px · 1 of 49 slices shown (11 of 17)]
[im 1/49]
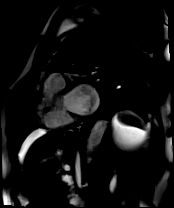

[Series 39: cine_trufi_cs_rt_short axis · oblique · 8.0mm · 1.73mm/px · 1 of 49 slices shown (12 of 17)]
[im 1/49]
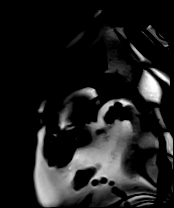

[Series 39: cine_trufi_cs_rt_short axis · oblique · 8.0mm · 1.73mm/px · 1 of 49 slices shown (13 of 17)]
[im 1/49]
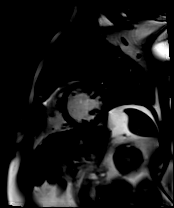

[Series 39: cine_trufi_cs_rt_short axis · oblique · 8.0mm · 1.73mm/px · 1 of 49 slices shown (14 of 17)]
[im 1/49]
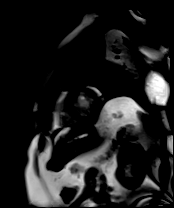

[Series 39: cine_trufi_cs_rt_short axis · oblique · 8.0mm · 1.73mm/px · 1 of 49 slices shown (15 of 17)]
[im 1/49]
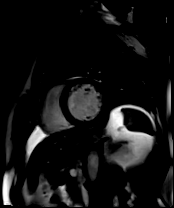

[Series 39: cine_trufi_cs_rt_short axis · oblique · 8.0mm · 1.73mm/px · 1 of 49 slices shown (16 of 17)]
[im 1/49]
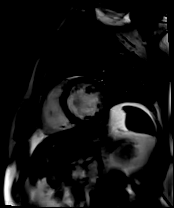

[Series 39: cine_trufi_cs_rt_short axis · oblique · 8.0mm · 1.73mm/px · 1 of 49 slices shown (17 of 17)]
[im 1/49]
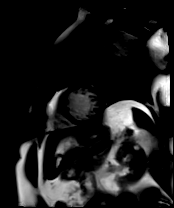

[45 of 48 positions shown; findings below may reference images not displayed]

FINDINGS: Limited images of the lung fields showed no gross abnormalities.

Images limited due to motion artifact.

The left ventricle is mildly dilated with normal wall thickness.
Trabeculation appears prominent towards the apex but difficult to
quantify for noncompaction evaluation due to motion artifact.
Moderate diffuse hypokinesis, LV EF 39%. Normal right ventricular
size and systolic function, EF 45%. Normal left and right atrial
sizes. the aortic valve is not visualized well enough to be certain
about cusp number. There does not appear to be significant
regurgitation or stenosis. Probably mild mitral regurgitation.

On delayed enhancement images, there was no myocardial late
gadolinium enhancement (LGE).

Measurements:

LVEDV 281 mL
LVSV 110 mL

LVEF 39%

RVEDV 213 mL
RVSV 97 mL
RVEF 45%

ECV 32% via T1 mapping.
IMPRESSION: 1.  Technically difficult study due to respiratory artifact.

2.   Mildly dilated left ventricle with diffuse hypokinesis, EF 39%.

3.  Normal RV size with EF 45%.

4. No myocardial LGE, so no definitive evidence for prior infarct,
myocarditis, or infiltrative disease.

5.  Mildly elevated ECV percentage is nonspecific.

Suspect nonischemic cardiomyopathy.

Timothy Leong

## 2021-12-06 ENCOUNTER — Emergency Department (HOSPITAL_COMMUNITY)
Admission: EM | Admit: 2021-12-06 | Discharge: 2021-12-31 | Disposition: E | Payer: Medicare Other | Attending: Emergency Medicine | Admitting: Emergency Medicine

## 2021-12-06 DIAGNOSIS — I10 Essential (primary) hypertension: Secondary | ICD-10-CM | POA: Diagnosis not present

## 2021-12-06 DIAGNOSIS — Z79899 Other long term (current) drug therapy: Secondary | ICD-10-CM | POA: Diagnosis not present

## 2021-12-06 DIAGNOSIS — Z7982 Long term (current) use of aspirin: Secondary | ICD-10-CM | POA: Insufficient documentation

## 2021-12-06 DIAGNOSIS — I499 Cardiac arrhythmia, unspecified: Secondary | ICD-10-CM | POA: Diagnosis not present

## 2021-12-06 DIAGNOSIS — I469 Cardiac arrest, cause unspecified: Secondary | ICD-10-CM | POA: Insufficient documentation

## 2021-12-06 DIAGNOSIS — Z743 Need for continuous supervision: Secondary | ICD-10-CM | POA: Diagnosis not present

## 2021-12-06 DIAGNOSIS — R404 Transient alteration of awareness: Secondary | ICD-10-CM | POA: Diagnosis not present

## 2021-12-06 DIAGNOSIS — I4901 Ventricular fibrillation: Secondary | ICD-10-CM | POA: Diagnosis not present

## 2021-12-06 DIAGNOSIS — R6889 Other general symptoms and signs: Secondary | ICD-10-CM | POA: Diagnosis not present

## 2021-12-06 MED ORDER — EPINEPHRINE 1 MG/10ML IJ SOSY
PREFILLED_SYRINGE | INTRAMUSCULAR | Status: AC | PRN
  Administered 2021-12-06 (×3): 1 mg via INTRAVENOUS

## 2021-12-06 MED ORDER — SODIUM BICARBONATE 8.4 % IV SOLN
INTRAVENOUS | Status: AC | PRN
  Administered 2021-12-06 (×2): 50 meq via INTRAVENOUS

## 2021-12-23 ENCOUNTER — Ambulatory Visit: Payer: Medicare Other | Admitting: Cardiology

## 2021-12-31 NOTE — Progress Notes (Signed)
?   31-Dec-2021 0158  ?Clinical Encounter Type  ?Visited With Patient  ?Visit Type Death  ?Referral From Nurse  ?Consult/Referral To Chaplain  ? ?Chaplain Jorene Guest responded to the page. The attending nurse said, the patient's daughter came but did not stay.This note was prepared by Jeanine Luz, M.Div..  For questions please contact by phone [phone].   ?

## 2021-12-31 NOTE — Code Documentation (Signed)
Patient time of death occurred at 64 ?

## 2021-12-31 NOTE — Code Documentation (Signed)
Cardiac activity via [place] by EDP- none noted ?

## 2021-12-31 NOTE — ED Provider Notes (Signed)
?[facility] ?Provider Note ? ? ?CSN: [id number] ?Arrival date & time: 31-Dec-2021  0101 ? ?  ? ?History ? ?No chief complaint on file. ? ? ?Jeffery [name] is a 66 y.o. male. ? ?HPI ? ?  ? ?This is a 66 year old male with history of nonischemic cardiomyopathy who presents postarrest.  Per EMS report, the wife heard the patient fall from another room around 11:30 PM.  Wife is bedbound and unable to attend to him.  She called a friend and EMS.  EMS gained access to the patient around midnight.  Reportedly bystander CPR may have started around 1145.  Initial rhythm was asystole.  He received multiple rounds of CPR and epinephrine.  They did achieve ROSC.  He subsequently was moved to the stretcher and he went into V-fib.  He was shocked and had 4 additional rounds of CPR, a total of 4 mg of epinephrine and amiodarone.  He had a non-perfusing bradycardic rhythm in the 30s.  Per EMS they began to pace him and had pulses.  Upon arrival to the emergency room, patient was pulseless.  Asystole on the monitor.  CPR was initiated.  Patient received approximately 10 minutes of additional CPR with 3 rounds of epinephrine and 2 A of bicarb.  No return of spontaneous circulation.  Time of death 1:11 AM. ? ?Home Medications ?Prior to Admission medications   ?Medication Sig Start Date End Date Taking? Authorizing Provider  ?aspirin EC 81 MG tablet Take 1 tablet (81 mg total) by mouth daily. Swallow whole. 12/01/20   Freada Bergeron, MD  ?carvedilol (COREG) 12.5 MG tablet Take 1 tablet (12.5 mg total) by mouth 2 (two) times daily. 04/05/21   Freada Bergeron, MD  ?dapagliflozin propanediol (FARXIGA) 10 MG TABS tablet Take 10 mg by mouth daily.    [provider]  ?Multiple Vitamin (MULTIVITAMIN) tablet Take 1 tablet by mouth daily.    [provider]  ?rosuvastatin (CRESTOR) 20 MG tablet Take 20 mg by mouth at bedtime. 11/30/20   [provider]  ?sacubitril-valsartan  (ENTRESTO) 97-103 MG Take 1 tablet by mouth 2 (two) times daily. 08/22/21   Freada Bergeron, MD  ?spironolactone (ALDACTONE) 25 MG tablet Take 1 tablet (25 mg total) by mouth daily. 04/05/21   Freada Bergeron, MD  ?   ? ?Allergies    ?Patient has no known allergies.   ? ?Review of Systems   ?Review of Systems  ?Unable to perform ROS: Patient unresponsive  ? ?Physical Exam ?Updated Vital Signs ?There were no vitals taken for this visit. ?Physical Exam ?Vitals and nursing note reviewed.  ?Constitutional:   ?   Appearance: He is well-developed.  ?   Comments: GCS 3  ?HENT:  ?   Head: Normocephalic.  ?   Comments: Dried blood about the forehead with laceration ?Eyes:  ?   Comments: Pupils 8 mm, fixed  ?Cardiovascular:  ?   Comments: Asystole, pulseless ?Pulmonary:  ?   Comments: ET tube in place, significant secretions, no spontaneous breathing ?Abdominal:  ?   General: There is no distension.  ?   Palpations: Abdomen is soft.  ?Musculoskeletal:     ?   General: No deformity or signs of injury.  ?   Comments: I/O left tibia  ?Skin: ?   General: Skin is warm and dry.  ?Neurological:  ?   Comments: Unresponsive, absent corneals  ? ? ?ED Results / Procedures / Treatments   ?Labs ?(  all labs ordered are listed, but only abnormal results are displayed) ?Labs Reviewed - No data to display ? ?EKG ?None ? ?Radiology ?No results found. ? ?Procedures ?[facility] KitchenCritical Care ?Performed by: Merryl Hacker, MD ?Authorized by: Merryl Hacker, MD  ? ?Critical care provider statement:  ?  Critical care time (minutes):  60 ?  Critical care was necessary to treat or prevent imminent or life-threatening deterioration of the following conditions:  Cardiac failure ?  Critical care was time spent personally by me on the following activities:  Development of treatment plan with patient or surrogate, discussions with consultants, evaluation of patient's response to treatment, examination of patient, ordering and review of laboratory  studies, ordering and review of radiographic studies, ordering and performing treatments and interventions, pulse oximetry, re-evaluation of patient's condition and review of old charts ?CPR ? ?Date/Time: Dec 25, 2021 1:58 AM ?Performed by: Merryl Hacker, MD ?Authorized by: Merryl Hacker, MD  ?CPR Procedure Details:    ?  Amount of time prior to administration of ACLS/BLS (minutes):  15 ?  ACLS/BLS initiated by EMS: Yes   ?  CPR/ACLS performed in the ED: Yes   ?  Duration of CPR (minutes):  10  ?CPR performed via ACLS guidelines under my direct supervision.  See RN documentation for details including defibrillator use, medications, doses and timing.  ? ? ?Medications Ordered in ED ?Medications  ?EPINEPHrine (ADRENALIN) 1 MG/10ML injection (0.1 mg Intravenous Given 12-25-2021 0108)  ?sodium bicarbonate injection (50 mEq Intravenous Given 12-25-21 0107)  ? ? ?ED Course/ Medical Decision Making/ A&P ?  ?                        ?Medical Decision Making ?Risk ?Prescription drug management. ? ? ?This patient presents to the ED for concern of cardiac arrest, this involves an extensive number of treatment options, and is a complaint that carries with it a high risk of complications and morbidity.  The differential diagnosis includes primary cardiac event, less likely traumatic arrest ? ?MDM:   ? ?This is a 66 year old male with a history of nonischemic cardiomyopathy, followed by cardiology who presents with cardiac arrest.  Unfortunately, we were unable to achieve ROSC.  At arrival to the ED, his pupils were fixed and dilated.  Poor neurologic prognosis as well.  See history of present illness for details regarding resuscitative efforts.   ? ?Time of death: 11:11 AM ?Spoke with ME, [place].  Patient not an ME case. ?Wife, Jeffery [name] was updated over the phone. ?Daughter and friend were updated in conference room. ?(Labs, imaging) ? ?Labs: ?I Ordered, and personally interpreted labs.  The pertinent results include:  None ? ?Imaging Studies ordered: ?I ordered imaging studies including none ?I independently visualized and interpreted imaging. ?I agree with the radiologist interpretation ? ?Additional history obtained from patient's wife.  External records from outside source obtained and reviewed including prior cardiology note ? ?Critical Interventions: ?CPR, advanced life support ? ?Consultations: ?I requested consultation with the NA,  and discussed lab and imaging findings as well as pertinent plan - they recommend: N/A ? ?Cardiac Monitoring: ?The patient was maintained on a cardiac monitor.  I personally viewed and interpreted the cardiac monitored which showed an underlying rhythm of: Asystole ? ?Reevaluation: ?After the interventions noted above, I reevaluated the patient and found that they have : Expired ? ? ?Considered admission for: Expired ? ?Social Determinants of Health: ?  ? ?Disposition: Expired ? ?Co morbidities that  complicate the patient evaluation ? ?Past Medical History:  ?Diagnosis Date  ? Cough due to ACE inhibitor   ? Hypertension   ? Internal hemorrhoid   ?  ? ?Medicines ?Meds ordered this encounter  ?Medications  ? EPINEPHrine (ADRENALIN) 1 MG/10ML injection  ? sodium bicarbonate injection  ?  ?I have reviewed the patients home medicines and have made adjustments as needed ? ?Problem List / ED Course: ?Problem List Items Addressed This Visit   ?None ?Visit Diagnoses   ? ? Cardiac arrest [facility])    -  Primary  ? ?  ?  ? ? ? ? ? ? ? ? ? ? ? ? ?Final Clinical Impression(s) / ED Diagnoses ?Final diagnoses:  ?None  ? ? ?Rx / DC Orders ?ED Discharge Orders   ? ? None  ? ?  ? ? ?  ?Merryl Hacker, MD ?31-Dec-2021 0201 ? ?

## 2021-12-31 NOTE — ED Triage Notes (Signed)
Pt arrives via [facility]. Per medic report, someone on another level of the home heard the patient fall around 2330. He was found on the floor. Cpr initiated by bystanders around 2345. On EMS arrival, he was asystole. 3 rounds CPR, then appeared to be in afib with pulses. When moving pt to stretcher, pt went into VFIB, pt was shocked, 4 rounds of epi, amio 325 and amio 150 were given. He then went into a brady rhythm, palpated around 33 with non perfusing PVC's, pt was placed on pacing pads en route. I/O established in the left leg. Emesis noted. Laceration to forehead area. Pt is not on blood thinners. Approx downtime 15 minutes.  ? ?Asystole on arrival to ED treatment room and CPR was initiated.  ?

## 2021-12-31 DEATH — deceased
